# Patient Record
Sex: Female | Born: 1945
Health system: Southern US, Community
[De-identification: ages and names within clinical notes are randomized; demographics above are authoritative.]

## PROBLEM LIST (undated history)

## (undated) DIAGNOSIS — I10 Essential (primary) hypertension: Secondary | ICD-10-CM

## (undated) DIAGNOSIS — E079 Disorder of thyroid, unspecified: Secondary | ICD-10-CM

---

## 2000-06-21 ENCOUNTER — Encounter: Payer: Self-pay | Admitting: *Deleted

## 2000-06-21 ENCOUNTER — Encounter: Admission: RE | Admit: 2000-06-21 | Discharge: 2000-06-21 | Payer: Self-pay | Admitting: *Deleted

## 2001-01-05 ENCOUNTER — Encounter: Payer: Self-pay | Admitting: *Deleted

## 2001-01-05 ENCOUNTER — Encounter: Admission: RE | Admit: 2001-01-05 | Discharge: 2001-01-05 | Payer: Self-pay | Admitting: *Deleted

## 2001-06-23 ENCOUNTER — Encounter: Payer: Self-pay | Admitting: *Deleted

## 2001-06-23 ENCOUNTER — Encounter: Admission: RE | Admit: 2001-06-23 | Discharge: 2001-06-23 | Payer: Self-pay | Admitting: *Deleted

## 2002-07-10 ENCOUNTER — Encounter: Admission: RE | Admit: 2002-07-10 | Discharge: 2002-07-10 | Payer: Self-pay | Admitting: Unknown Physician Specialty

## 2002-07-10 ENCOUNTER — Encounter: Payer: Self-pay | Admitting: Unknown Physician Specialty

## 2003-07-13 ENCOUNTER — Encounter: Admission: RE | Admit: 2003-07-13 | Discharge: 2003-07-13 | Payer: Self-pay

## 2004-02-04 ENCOUNTER — Other Ambulatory Visit: Admission: RE | Admit: 2004-02-04 | Discharge: 2004-02-04 | Payer: Self-pay | Admitting: Family Medicine

## 2004-07-16 ENCOUNTER — Encounter: Admission: RE | Admit: 2004-07-16 | Discharge: 2004-07-16 | Payer: Self-pay | Admitting: Family Medicine

## 2005-04-06 ENCOUNTER — Other Ambulatory Visit: Admission: RE | Admit: 2005-04-06 | Discharge: 2005-04-06 | Payer: Self-pay | Admitting: Family Medicine

## 2005-07-16 ENCOUNTER — Encounter: Admission: RE | Admit: 2005-07-16 | Discharge: 2005-07-16 | Payer: Self-pay | Admitting: Family Medicine

## 2006-07-19 ENCOUNTER — Encounter: Admission: RE | Admit: 2006-07-19 | Discharge: 2006-07-19 | Payer: Self-pay | Admitting: Family Medicine

## 2006-09-14 ENCOUNTER — Other Ambulatory Visit: Admission: RE | Admit: 2006-09-14 | Discharge: 2006-09-14 | Payer: Self-pay | Admitting: Family Medicine

## 2007-02-04 ENCOUNTER — Ambulatory Visit (HOSPITAL_COMMUNITY): Admission: RE | Admit: 2007-02-04 | Discharge: 2007-02-04 | Payer: Self-pay | Admitting: Family Medicine

## 2007-07-22 ENCOUNTER — Encounter: Admission: RE | Admit: 2007-07-22 | Discharge: 2007-07-22 | Payer: Self-pay | Admitting: Family Medicine

## 2008-07-23 ENCOUNTER — Encounter: Admission: RE | Admit: 2008-07-23 | Discharge: 2008-07-23 | Payer: Self-pay | Admitting: Family Medicine

## 2009-07-26 ENCOUNTER — Encounter: Admission: RE | Admit: 2009-07-26 | Discharge: 2009-07-26 | Payer: Self-pay | Admitting: Family Medicine

## 2010-07-29 ENCOUNTER — Encounter
Admission: RE | Admit: 2010-07-29 | Discharge: 2010-07-29 | Payer: Self-pay | Source: Home / Self Care | Attending: Family Medicine | Admitting: Family Medicine

## 2011-06-22 ENCOUNTER — Other Ambulatory Visit: Payer: Self-pay | Admitting: Family Medicine

## 2011-06-22 DIAGNOSIS — Z1231 Encounter for screening mammogram for malignant neoplasm of breast: Secondary | ICD-10-CM

## 2011-07-31 ENCOUNTER — Ambulatory Visit: Payer: Self-pay

## 2011-12-21 ENCOUNTER — Ambulatory Visit
Admission: RE | Admit: 2011-12-21 | Discharge: 2011-12-21 | Disposition: A | Discharge status: Home / Self Care | Payer: Medicare Other | Source: Ambulatory Visit | Attending: Family Medicine | Admitting: Family Medicine

## 2011-12-21 DIAGNOSIS — Z1231 Encounter for screening mammogram for malignant neoplasm of breast: Secondary | ICD-10-CM

## 2012-11-22 ENCOUNTER — Other Ambulatory Visit: Payer: Self-pay

## 2012-11-22 DIAGNOSIS — Z1231 Encounter for screening mammogram for malignant neoplasm of breast: Secondary | ICD-10-CM

## 2012-12-28 ENCOUNTER — Ambulatory Visit: Payer: Medicare Other

## 2013-01-04 ENCOUNTER — Encounter: Payer: Medicare Other | Admitting: Nurse Practitioner

## 2013-01-12 ENCOUNTER — Other Ambulatory Visit: Payer: Self-pay | Admitting: Nurse Practitioner

## 2013-01-12 ENCOUNTER — Ambulatory Visit
Admission: RE | Admit: 2013-01-12 | Discharge: 2013-01-12 | Disposition: A | Discharge status: Home / Self Care | Payer: Medicare Other | Source: Ambulatory Visit

## 2013-01-12 DIAGNOSIS — R928 Other abnormal and inconclusive findings on diagnostic imaging of breast: Secondary | ICD-10-CM

## 2013-01-12 DIAGNOSIS — Z1231 Encounter for screening mammogram for malignant neoplasm of breast: Secondary | ICD-10-CM

## 2013-01-13 ENCOUNTER — Telehealth: Payer: Self-pay | Admitting: Family Medicine

## 2013-01-24 NOTE — Telephone Encounter (Signed)
Error

## 2013-02-07 ENCOUNTER — Ambulatory Visit
Admission: RE | Admit: 2013-02-07 | Discharge: 2013-02-07 | Disposition: A | Discharge status: Home / Self Care | Payer: Medicare Other | Source: Ambulatory Visit | Attending: Nurse Practitioner | Admitting: Nurse Practitioner

## 2013-02-07 DIAGNOSIS — R928 Other abnormal and inconclusive findings on diagnostic imaging of breast: Secondary | ICD-10-CM

## 2013-11-20 ENCOUNTER — Telehealth: Payer: Self-pay | Admitting: Nurse Practitioner

## 2013-11-20 NOTE — Telephone Encounter (Signed)
[facility]

## 2013-11-21 ENCOUNTER — Ambulatory Visit (INDEPENDENT_AMBULATORY_CARE_PROVIDER_SITE_OTHER): Payer: Medicare Other | Admitting: Nurse Practitioner

## 2013-11-21 ENCOUNTER — Encounter: Payer: Self-pay | Admitting: Nurse Practitioner

## 2013-11-21 VITALS — BP 131/83 | HR 87 | Temp 97.8°F | Ht 65.0 in | Wt 203.0 lb

## 2013-11-21 DIAGNOSIS — N63 Unspecified lump in unspecified breast: Secondary | ICD-10-CM

## 2013-11-21 DIAGNOSIS — N6310 Unspecified lump in the right breast, unspecified quadrant: Secondary | ICD-10-CM

## 2013-11-21 NOTE — Progress Notes (Signed)
   Subjective:    Patient ID: Meredith Ruiz, female    DOB: 06/28/1946, 68 y.o.   MRN: [id number]  HPI Patient in today c/o finding a lump in right breast- Found lump 2-3 nights ago- nontender and nonmobile. Last mammogram was last year and it was negative on Right side.    Review of Systems  Respiratory: Negative.   Cardiovascular: Negative.   All other systems reviewed and are negative.       Objective:   Physical Exam  Constitutional: She appears well-developed and well-nourished.  Cardiovascular: Normal rate, regular rhythm and normal heart sounds.   Pulmonary/Chest: Effort normal and breath sounds normal. Right breast exhibits mass. Right breast exhibits no inverted nipple, no nipple discharge, no skin change and no tenderness. Left breast exhibits no inverted nipple, no mass, no nipple discharge, no skin change and no tenderness.    Skin: Skin is warm and dry.  Psychiatric: She has a normal mood and affect. Her behavior is normal. Judgment and thought content normal.    BP 131/83  Pulse 87  Temp(Src) 97.8 F (36.6 C) (Oral)  Ht 5\' 5"  (1.651 m)  Wt 203 lb (92.08 kg)  BMI 33.78 kg/m2       Assessment & Plan:   1. Breast mass, right    Orders Placed This Encounter  Procedures  . MM Digital Diagnostic Unilat R    Standing Status: Future     Number of Occurrences:      Standing Expiration Date: 01/22/2015    Order Specific Question:  Reason for Exam (SYMPTOM  OR DIAGNOSIS REQUIRED)    Answer:  right screening    Order Specific Question:  Preferred imaging location?    [facility]   Keep check on area until mammogram is scheduled   Mary-Margaret Daphine DeutscherMartin, FNP

## 2013-11-21 NOTE — Patient Instructions (Signed)

## 2013-11-24 ENCOUNTER — Other Ambulatory Visit: Payer: Self-pay | Admitting: Nurse Practitioner

## 2013-11-24 DIAGNOSIS — N6310 Unspecified lump in the right breast, unspecified quadrant: Secondary | ICD-10-CM

## 2014-01-02 ENCOUNTER — Ambulatory Visit (INDEPENDENT_AMBULATORY_CARE_PROVIDER_SITE_OTHER): Payer: Medicare Other | Admitting: Nurse Practitioner

## 2014-01-02 ENCOUNTER — Encounter: Payer: Self-pay | Admitting: Nurse Practitioner

## 2014-01-02 VITALS — BP 165/86 | HR 102 | Temp 98.7°F | Ht 65.0 in | Wt 204.0 lb

## 2014-01-02 DIAGNOSIS — Z0000 Encounter for general adult medical examination without abnormal findings: Secondary | ICD-10-CM

## 2014-01-02 DIAGNOSIS — Z124 Encounter for screening for malignant neoplasm of cervix: Secondary | ICD-10-CM | POA: Diagnosis not present

## 2014-01-02 DIAGNOSIS — Z01419 Encounter for gynecological examination (general) (routine) without abnormal findings: Secondary | ICD-10-CM

## 2014-01-02 DIAGNOSIS — R87619 Unspecified abnormal cytological findings in specimens from cervix uteri: Secondary | ICD-10-CM | POA: Diagnosis not present

## 2014-01-02 LAB — POCT URINALYSIS DIPSTICK
Bilirubin, UA: NEGATIVE
Blood, UA: NEGATIVE
GLUCOSE UA: NEGATIVE
KETONES UA: NEGATIVE
LEUKOCYTES UA: NEGATIVE
Nitrite, UA: NEGATIVE
PROTEIN UA: NEGATIVE
Urobilinogen, UA: NEGATIVE
pH, UA: 5

## 2014-01-02 NOTE — Progress Notes (Signed)
   Subjective:    Patient ID: Meredith Ruiz, female    DOB: 02/18/1946, 68 y.o.   MRN: [id number]  HPI Patient in today for CPE and PAP- She has no current medical problems and is on no meds. SHe says that she is doing well and has no complaints.    Review of Systems  Constitutional: Negative.   HENT: Negative.   Respiratory: Negative.   Cardiovascular: Negative.   Gastrointestinal: Negative.   Genitourinary: Negative.   Neurological: Negative.   Hematological: Negative.   Psychiatric/Behavioral: Negative.   All other systems reviewed and are negative.      Objective:   Physical Exam  Constitutional: She is oriented to person, place, and time. She appears well-developed and well-nourished.  HENT:  Head: Normocephalic.  Right Ear: Hearing, tympanic membrane, external ear and ear canal normal.  Left Ear: Hearing, tympanic membrane, external ear and ear canal normal.  Nose: Nose normal.  Mouth/Throat: Uvula is midline and oropharynx is clear and moist.  Eyes: Conjunctivae and EOM are normal. Pupils are equal, round, and reactive to light.  Neck: Normal range of motion and full passive range of motion without pain. Neck supple. No JVD present. Carotid bruit is not present. No mass and no thyromegaly present.  Cardiovascular: Normal rate, normal heart sounds and intact distal pulses.   No murmur heard. Pulmonary/Chest: Effort normal and breath sounds normal. Right breast exhibits no inverted nipple, no mass, no nipple discharge, no skin change and no tenderness. Left breast exhibits no inverted nipple, no mass, no nipple discharge, no skin change and no tenderness.  Abdominal: Soft. Bowel sounds are normal. She exhibits no mass. There is no tenderness.  Genitourinary: Vagina normal and uterus normal. No breast swelling, tenderness, discharge or bleeding.  bimanual exam-No adnexal masses or tenderness. Cervix nonparous and pink- no vaginal discharge.  Musculoskeletal: Normal range  of motion.  Lymphadenopathy:    She has no cervical adenopathy.  Neurological: She is alert and oriented to person, place, and time.  Skin: Skin is warm and dry.  Psychiatric: She has a normal mood and affect. Her behavior is normal. Judgment and thought content normal.    BP 165/86  Pulse 102  Temp(Src) 98.7 F (37.1 C) (Oral)  Ht 5\' 5"  (1.651 m)  Wt 204 lb (92.534 kg)  BMI 33.95 kg/m2 ]      Assessment & Plan:   1. Annual physical exam   2. Encounter for routine gynecological examination    Orders Placed This Encounter  Procedures  . POCT urinalysis dipstick   Patient refuses blood work says that insurance will not pay for it Refuses all health maintenance Diet and exercise encourgaged RTO in 1 year and prn  Meredith Daphine DeutscherMartin, FNP

## 2014-01-02 NOTE — Patient Instructions (Signed)

## 2014-01-04 LAB — PAP IG (IMAGE GUIDED): PAP Smear Comment: 0

## 2014-01-09 ENCOUNTER — Telehealth: Payer: Self-pay | Admitting: Nurse Practitioner

## 2014-01-09 NOTE — Telephone Encounter (Signed)
Called to tell patient that we would contact with a follow up appointment for pap.  Number has message of unavailable.

## 2014-01-11 NOTE — Telephone Encounter (Signed)
Wrong number in epic to call patient say it is disconnected

## 2014-01-31 NOTE — Telephone Encounter (Signed)
Patient aware.

## 2014-12-13 DIAGNOSIS — W010XXA Fall on same level from slipping, tripping and stumbling without subsequent striking against object, initial encounter: Secondary | ICD-10-CM | POA: Diagnosis not present

## 2014-12-13 DIAGNOSIS — Y92481 Parking lot as the place of occurrence of the external cause: Secondary | ICD-10-CM | POA: Diagnosis not present

## 2014-12-13 DIAGNOSIS — S5011XA Contusion of right forearm, initial encounter: Secondary | ICD-10-CM | POA: Diagnosis not present

## 2014-12-13 DIAGNOSIS — S8001XA Contusion of right knee, initial encounter: Secondary | ICD-10-CM | POA: Diagnosis not present

## 2014-12-13 DIAGNOSIS — M79631 Pain in right forearm: Secondary | ICD-10-CM | POA: Diagnosis not present

## 2014-12-13 DIAGNOSIS — Z87891 Personal history of nicotine dependence: Secondary | ICD-10-CM | POA: Diagnosis not present

## 2014-12-13 DIAGNOSIS — S59911A Unspecified injury of right forearm, initial encounter: Secondary | ICD-10-CM | POA: Diagnosis not present

## 2015-02-11 ENCOUNTER — Other Ambulatory Visit: Payer: Self-pay

## 2016-01-07 ENCOUNTER — Encounter: Payer: Medicare Other | Admitting: Nurse Practitioner

## 2016-01-09 ENCOUNTER — Encounter: Payer: Self-pay | Admitting: Nurse Practitioner

## 2016-01-09 ENCOUNTER — Ambulatory Visit (INDEPENDENT_AMBULATORY_CARE_PROVIDER_SITE_OTHER): Payer: Medicare Other | Admitting: Nurse Practitioner

## 2016-01-09 ENCOUNTER — Ambulatory Visit (INDEPENDENT_AMBULATORY_CARE_PROVIDER_SITE_OTHER): Payer: Medicare Other

## 2016-01-09 VITALS — BP 153/84 | HR 79 | Temp 97.0°F | Ht 65.0 in | Wt 200.4 lb

## 2016-01-09 DIAGNOSIS — Z1159 Encounter for screening for other viral diseases: Secondary | ICD-10-CM | POA: Diagnosis not present

## 2016-01-09 DIAGNOSIS — Z1212 Encounter for screening for malignant neoplasm of rectum: Secondary | ICD-10-CM | POA: Diagnosis not present

## 2016-01-09 DIAGNOSIS — I10 Essential (primary) hypertension: Secondary | ICD-10-CM | POA: Diagnosis not present

## 2016-01-09 DIAGNOSIS — Z0000 Encounter for general adult medical examination without abnormal findings: Secondary | ICD-10-CM

## 2016-01-09 DIAGNOSIS — Z01419 Encounter for gynecological examination (general) (routine) without abnormal findings: Secondary | ICD-10-CM | POA: Diagnosis not present

## 2016-01-09 DIAGNOSIS — R875 Abnormal microbiological findings in specimens from female genital organs: Secondary | ICD-10-CM | POA: Diagnosis not present

## 2016-01-09 MED ORDER — LISINOPRIL 10 MG PO TABS: 10.0000 mg | ORAL_TABLET | Freq: Every day | ORAL | Status: DC

## 2016-01-09 NOTE — Addendum Note (Signed)
Addended by: Margorie JohnJOHNSON, Louellen Haldeman M on: 01/09/2016 04:04 PM   Modules accepted: Orders

## 2016-01-09 NOTE — Patient Instructions (Signed)
Hypertension Hypertension, commonly called high blood pressure, is when the force of blood pumping through your arteries is too strong. Your arteries are the blood vessels that carry blood from your heart throughout your body. A blood pressure reading consists of a higher number over a lower number, such as 110/72. The higher number (systolic) is the pressure inside your arteries when your heart pumps. The lower number (diastolic) is the pressure inside your arteries when your heart relaxes. Ideally you want your blood pressure below 120/80. Hypertension forces your heart to work harder to pump blood. Your arteries may become narrow or stiff. Having untreated or uncontrolled hypertension can cause heart attack, stroke, kidney disease, and other problems. RISK FACTORS Some risk factors for high blood pressure are controllable. Others are not.  Risk factors you cannot control include:   Race. You may be at higher risk if you are African American.  Age. Risk increases with age.  Gender. Men are at higher risk than women before age 45 years. After age 65, women are at higher risk than men. Risk factors you can control include:  Not getting enough exercise or physical activity.  Being overweight.  Getting too much fat, sugar, calories, or salt in your diet.  Drinking too much alcohol. SIGNS AND SYMPTOMS Hypertension does not usually cause signs or symptoms. Extremely high blood pressure (hypertensive crisis) may cause headache, anxiety, shortness of breath, and nosebleed. DIAGNOSIS To check if you have hypertension, your health care provider will measure your blood pressure while you are seated, with your arm held at the level of your heart. It should be measured at least twice using the same arm. Certain conditions can cause a difference in blood pressure between your right and left arms. A blood pressure reading that is higher than normal on one occasion does not mean that you need treatment. If  it is not clear whether you have high blood pressure, you may be asked to return on a different day to have your blood pressure checked again. Or, you may be asked to monitor your blood pressure at home for 1 or more weeks. TREATMENT Treating high blood pressure includes making lifestyle changes and possibly taking medicine. Living a healthy lifestyle can help lower high blood pressure. You may need to change some of your habits. Lifestyle changes may include:  Following the DASH diet. This diet is high in fruits, vegetables, and whole grains. It is low in salt, red meat, and added sugars.  Keep your sodium intake below 2,300 mg per day.  Getting at least 30-45 minutes of aerobic exercise at least 4 times per week.  Losing weight if necessary.  Not smoking.  Limiting alcoholic beverages.  Learning ways to reduce stress. Your health care provider may prescribe medicine if lifestyle changes are not enough to get your blood pressure under control, and if one of the following is true:  You are 18-59 years of age and your systolic blood pressure is above 140.  You are 60 years of age or older, and your systolic blood pressure is above 150.  Your diastolic blood pressure is above 90.  You have diabetes, and your systolic blood pressure is over 140 or your diastolic blood pressure is over 90.  You have kidney disease and your blood pressure is above 140/90.  You have heart disease and your blood pressure is above 140/90. Your personal target blood pressure may vary depending on your medical conditions, your age, and other factors. HOME CARE INSTRUCTIONS    Have your blood pressure rechecked as directed by your health care provider.   Take medicines only as directed by your health care provider. Follow the directions carefully. Blood pressure medicines must be taken as prescribed. The medicine does not work as well when you skip doses. Skipping doses also puts you at risk for  problems.  Do not smoke.   Monitor your blood pressure at home as directed by your health care provider. SEEK MEDICAL CARE IF:   You think you are having a reaction to medicines taken.  You have recurrent headaches or feel dizzy.  You have swelling in your ankles.  You have trouble with your vision. SEEK IMMEDIATE MEDICAL CARE IF:  You develop a severe headache or confusion.  You have unusual weakness, numbness, or feel faint.  You have severe chest or abdominal pain.  You vomit repeatedly.  You have trouble breathing. MAKE SURE YOU:   Understand these instructions.  Will watch your condition.  Will get help right away if you are not doing well or get worse.   This information is not intended to replace advice given to you by your health care provider. Make sure you discuss any questions you have with your health care provider.   Document Released: 08/03/2005 Document Revised: 12/18/2014 Document Reviewed: 05/26/2013 Elsevier Interactive Patient Education 2016 Elsevier Inc.  

## 2016-01-09 NOTE — Progress Notes (Signed)
   Subjective:    Patient ID: Meredith Ruiz, female    DOB: [birth year], 70 y.o.   MRN: [id number]  HPI Patient comes in today for annual physical exam and PAP. She has no current medical problems and is on no meds. SHe says that she is doing well without complaints today.    Review of Systems  Constitutional: Negative.   HENT: Negative.   Respiratory: Negative.   Cardiovascular: Negative.   Gastrointestinal: Negative.   Genitourinary: Negative.   Neurological: Negative.   Psychiatric/Behavioral: Negative.   All other systems reviewed and are negative.      Objective:   Physical Exam  Constitutional: She is oriented to person, place, and time. She appears well-developed and well-nourished.  HENT:  Head: Normocephalic.  Right Ear: Hearing, tympanic membrane, external ear and ear canal normal.  Left Ear: Hearing, tympanic membrane, external ear and ear canal normal.  Nose: Nose normal.  Mouth/Throat: Uvula is midline and oropharynx is clear and moist.  Eyes: Conjunctivae and EOM are normal. Pupils are equal, round, and reactive to light.  Neck: Normal range of motion and full passive range of motion without pain. Neck supple. No JVD present. Carotid bruit is not present. No thyroid mass and no thyromegaly present.  Cardiovascular: Normal rate, normal heart sounds and intact distal pulses.   No murmur heard. Pulmonary/Chest: Effort normal and breath sounds normal. Right breast exhibits no inverted nipple, no mass, no nipple discharge, no skin change and no tenderness. Left breast exhibits no inverted nipple, no mass, no nipple discharge, no skin change and no tenderness.  Abdominal: Soft. Bowel sounds are normal. She exhibits no mass. There is no tenderness.  Genitourinary: Vagina normal and uterus normal. No breast swelling, tenderness, discharge or bleeding.  bimanual exam-No adnexal masses or tenderness. Cervix nonparous and pink- no vaginal discharge  Musculoskeletal: Normal  range of motion.  Lymphadenopathy:    She has no cervical adenopathy.  Neurological: She is alert and oriented to person, place, and time.  Skin: Skin is warm and dry.  Multiple greyish moles all over back- all have regular broders.  Psychiatric: She has a normal mood and affect. Her behavior is normal. Judgment and thought content normal.   BP 153/84 mmHg  Pulse 79  Temp(Src) 97 F (36.1 C) (Oral)  Ht _0  (1.651 m)  Wt 200 lb 6.4 oz (90.901 kg)  BMI 33.35 kg/m2  Chest x ray- no cardiopulmonary disease noted-Preliminary reading by Ronnald Collum, FNP  [facility]   EKG- Kerry Hough, FNP      Assessment & Plan:  1. Annual physical exam - DG Chest 2 View; Future - Thyroid Panel With TSH  2. Encounter for routine gynecological examination - Pap IG (Image Guided)  3. Screening for malignant neoplasm of the rectum - Fecal occult blood, imunochemical; Future  4. Need for hepatitis C screening test - Hepatitis C antibody  5. Essential hypertension/newly diagnosed Do not add salt o diet - DG Chest 2 View; Future - EKG 12-Lead - CMP14+EGFR - Lipid panel - lisinopril (PRINIVIL,ZESTRIL) 10 MG tablet; Take 1 tablet (10 mg total) by mouth daily.  Dispense: 90 tablet; Refill: 3    Labs pending Health maintenance reviewed Diet and exercise encouraged Continue all meds Follow up  In 6 months   [place], FNP

## 2016-01-10 ENCOUNTER — Other Ambulatory Visit: Payer: Self-pay | Admitting: Nurse Practitioner

## 2016-01-10 DIAGNOSIS — E039 Hypothyroidism, unspecified: Secondary | ICD-10-CM

## 2016-01-10 LAB — CMP14+EGFR
A/G RATIO: 1.6 (ref 1.2–2.2)
ALBUMIN: 4.2 g/dL (ref 3.6–4.8)
ALT: 10 IU/L (ref 0–32)
AST: 11 IU/L (ref 0–40)
Alkaline Phosphatase: 135 IU/L — ABNORMAL HIGH (ref 39–117)
BUN / CREAT RATIO: 17 (ref 12–28)
BUN: 16 mg/dL (ref 8–27)
Bilirubin Total: 0.3 mg/dL (ref 0.0–1.2)
CALCIUM: 9.3 mg/dL (ref 8.7–10.3)
CO2: 24 mmol/L (ref 18–29)
CREATININE: 0.94 mg/dL (ref 0.57–1.00)
Chloride: 104 mmol/L (ref 96–106)
GFR, EST AFRICAN AMERICAN: 72 mL/min/{1.73_m2} (ref >59)
GFR, EST NON AFRICAN AMERICAN: 62 mL/min/{1.73_m2} (ref >59)
GLOBULIN, TOTAL: 2.7 g/dL (ref 1.5–4.5)
Glucose: 116 mg/dL — ABNORMAL HIGH (ref 65–99)
Potassium: 4.1 mmol/L (ref 3.5–5.2)
SODIUM: 143 mmol/L (ref 134–144)
Total Protein: 6.9 g/dL (ref 6.0–8.5)

## 2016-01-10 LAB — LIPID PANEL
CHOL/HDL RATIO: 3.9 ratio (ref 0.0–4.4)
Cholesterol, Total: 185 mg/dL (ref 100–199)
HDL: 47 mg/dL (ref >39)
LDL CALC: 121 mg/dL — AB (ref 0–99)
Triglycerides: 86 mg/dL (ref 0–149)
VLDL Cholesterol Cal: 17 mg/dL (ref 5–40)

## 2016-01-10 LAB — THYROID PANEL WITH TSH
Free Thyroxine Index: 1.4 (ref 1.2–4.9)
T3 UPTAKE RATIO: 27 % (ref 24–39)
T4 TOTAL: 5.3 ug/dL (ref 4.5–12.0)
TSH: 7.43 u[IU]/mL — AB (ref 0.450–4.500)

## 2016-01-10 LAB — FECAL OCCULT BLOOD, IMMUNOCHEMICAL: FECAL OCCULT BLD: NEGATIVE

## 2016-01-10 LAB — HEPATITIS C ANTIBODY: Hep C Virus Ab: <0.1 s/co ratio (ref 0.0–0.9)

## 2016-01-10 MED ORDER — LEVOTHYROXINE SODIUM 50 MCG PO TABS: 50.0000 ug | ORAL_TABLET | Freq: Every day | ORAL | Status: DC

## 2016-01-14 ENCOUNTER — Other Ambulatory Visit: Payer: Self-pay | Admitting: Nurse Practitioner

## 2016-01-14 LAB — PAP IG (IMAGE GUIDED): PAP Smear Comment: 0

## 2016-01-14 MED ORDER — FLUCONAZOLE 150 MG PO TABS: 150.0000 mg | ORAL_TABLET | Freq: Once | ORAL | Status: DC

## 2016-01-15 ENCOUNTER — Other Ambulatory Visit: Payer: Self-pay | Admitting: *Deleted

## 2016-01-15 DIAGNOSIS — E039 Hypothyroidism, unspecified: Secondary | ICD-10-CM

## 2016-02-17 ENCOUNTER — Telehealth: Payer: Self-pay | Admitting: Nurse Practitioner

## 2016-02-19 NOTE — Telephone Encounter (Signed)
Labs mailed

## 2016-03-16 ENCOUNTER — Encounter: Payer: Medicare Other | Admitting: *Deleted

## 2016-03-16 DIAGNOSIS — Z1231 Encounter for screening mammogram for malignant neoplasm of breast: Secondary | ICD-10-CM | POA: Diagnosis not present

## 2016-06-15 ENCOUNTER — Telehealth: Payer: Self-pay

## 2016-06-15 NOTE — Telephone Encounter (Signed)
Form brought in has to be filled out by patient not provider- why can she not do jury duty

## 2016-06-15 NOTE — Telephone Encounter (Signed)
lmtcb jkp 10/30 

## 2016-06-15 NOTE — Telephone Encounter (Signed)
Patient would like a note for jury duty. She has a paper that she will drop by to be filled out but she would also like a note to send in as well. Please advise and route to pool B. Please call her at [phone]

## 2016-06-17 NOTE — Telephone Encounter (Signed)
Advised patient that we couldn't write an excuse for jury duty without a valid medical reason why she can't do it. Patient states that she has thyroid trouble and is 70 years old. Advised patient we didn't have medical reason for note. Patient came back to office and picked up forms that she had brought by

## 2016-07-02 ENCOUNTER — Other Ambulatory Visit: Payer: Self-pay | Admitting: Nurse Practitioner

## 2016-07-02 DIAGNOSIS — E039 Hypothyroidism, unspecified: Secondary | ICD-10-CM

## 2016-07-13 ENCOUNTER — Ambulatory Visit: Payer: Medicare Other | Admitting: Nurse Practitioner

## 2016-07-13 DIAGNOSIS — M79601 Pain in right arm: Secondary | ICD-10-CM | POA: Diagnosis not present

## 2016-07-13 DIAGNOSIS — R0902 Hypoxemia: Secondary | ICD-10-CM | POA: Diagnosis not present

## 2016-07-13 DIAGNOSIS — Z23 Encounter for immunization: Secondary | ICD-10-CM | POA: Diagnosis not present

## 2016-07-13 DIAGNOSIS — S59911A Unspecified injury of right forearm, initial encounter: Secondary | ICD-10-CM | POA: Diagnosis not present

## 2016-07-13 DIAGNOSIS — S50311A Abrasion of right elbow, initial encounter: Secondary | ICD-10-CM | POA: Diagnosis not present

## 2016-07-13 DIAGNOSIS — S62161A Displaced fracture of pisiform, right wrist, initial encounter for closed fracture: Secondary | ICD-10-CM | POA: Diagnosis not present

## 2016-07-13 DIAGNOSIS — M25569 Pain in unspecified knee: Secondary | ICD-10-CM | POA: Diagnosis not present

## 2016-07-13 DIAGNOSIS — S80211A Abrasion, right knee, initial encounter: Secondary | ICD-10-CM | POA: Diagnosis not present

## 2016-07-13 DIAGNOSIS — M79641 Pain in right hand: Secondary | ICD-10-CM | POA: Diagnosis not present

## 2016-07-13 DIAGNOSIS — S52124A Nondisplaced fracture of head of right radius, initial encounter for closed fracture: Secondary | ICD-10-CM | POA: Diagnosis not present

## 2016-07-13 DIAGNOSIS — S80212A Abrasion, left knee, initial encounter: Secondary | ICD-10-CM | POA: Diagnosis not present

## 2016-07-13 DIAGNOSIS — M25561 Pain in right knee: Secondary | ICD-10-CM | POA: Diagnosis not present

## 2016-07-13 DIAGNOSIS — S4991XA Unspecified injury of right shoulder and upper arm, initial encounter: Secondary | ICD-10-CM | POA: Diagnosis not present

## 2016-07-13 DIAGNOSIS — S62164A Nondisplaced fracture of pisiform, right wrist, initial encounter for closed fracture: Secondary | ICD-10-CM | POA: Diagnosis not present

## 2016-07-13 DIAGNOSIS — S60511A Abrasion of right hand, initial encounter: Secondary | ICD-10-CM | POA: Diagnosis not present

## 2016-07-13 DIAGNOSIS — W172XXA Fall into hole, initial encounter: Secondary | ICD-10-CM | POA: Diagnosis not present

## 2016-07-13 DIAGNOSIS — S52121A Displaced fracture of head of right radius, initial encounter for closed fracture: Secondary | ICD-10-CM | POA: Diagnosis not present

## 2016-07-13 DIAGNOSIS — M25562 Pain in left knee: Secondary | ICD-10-CM | POA: Diagnosis not present

## 2016-07-13 DIAGNOSIS — M25511 Pain in right shoulder: Secondary | ICD-10-CM | POA: Diagnosis not present

## 2016-07-13 DIAGNOSIS — S6991XA Unspecified injury of right wrist, hand and finger(s), initial encounter: Secondary | ICD-10-CM | POA: Diagnosis not present

## 2016-07-22 DIAGNOSIS — Z9181 History of falling: Secondary | ICD-10-CM | POA: Diagnosis not present

## 2016-07-22 DIAGNOSIS — M79601 Pain in right arm: Secondary | ICD-10-CM | POA: Diagnosis not present

## 2016-07-27 ENCOUNTER — Ambulatory Visit (INDEPENDENT_AMBULATORY_CARE_PROVIDER_SITE_OTHER): Payer: Medicare Other | Admitting: Nurse Practitioner

## 2016-07-27 ENCOUNTER — Encounter: Payer: Self-pay | Admitting: Nurse Practitioner

## 2016-07-27 VITALS — BP 142/76 | HR 80 | Temp 97.3°F | Ht 65.0 in | Wt 197.0 lb

## 2016-07-27 DIAGNOSIS — E039 Hypothyroidism, unspecified: Secondary | ICD-10-CM

## 2016-07-27 DIAGNOSIS — I10 Essential (primary) hypertension: Secondary | ICD-10-CM

## 2016-07-27 DIAGNOSIS — Z6832 Body mass index (BMI) 32.0-32.9, adult: Secondary | ICD-10-CM | POA: Diagnosis not present

## 2016-07-27 MED ORDER — LISINOPRIL 20 MG PO TABS: 20.0000 mg | ORAL_TABLET | Freq: Every day | ORAL | 1 refills | Status: DC

## 2016-07-27 NOTE — Progress Notes (Signed)
Subjective:    Patient ID: Meredith Ruiz, female    DOB: Feb 04, 1946, 70 y.o.   MRN: [id number]   Patient here today for follow up of chronic medical problems.  Outpatient Encounter Prescriptions as of 07/27/2016  Medication Sig  . aspirin 81 MG tablet Take 81 mg by mouth daily.  Marland Kitchen levothyroxine (SYNTHROID, LEVOTHROID) 50 MCG tablet TAKE ONE TABLET BY MOUTH ONCE DAILY BEFORE BREAKFAST  . lisinopril (PRINIVIL,ZESTRIL) 10 MG tablet Take 1 tablet (10 mg total) by mouth daily.    Hypertension  This is a recurrent problem. Episode onset: dx 6 months ago. The problem has been gradually improving since onset. The problem is controlled. Pertinent negatives include no anxiety, chest pain, headaches, palpitations or shortness of breath. Risk factors for coronary artery disease include obesity, post-menopausal state and sedentary lifestyle. Past treatments include ACE inhibitors. The current treatment provides significant improvement. Compliance problems include diet and exercise.  Hypertensive end-organ damage includes a thyroid problem. There is no history of CAD/MI or retinopathy.  Thyroid Problem  Presents for follow-up (hypohyroidism) visit. Patient reports no palpitations. The symptoms have been stable.    * Patient fell 07/13/16 and was told in ER at [facility] that she had fracture in right elbow- saw ortho one week later and was told no fracture- still has some right arm soreness with use but is better.    Review of Systems  Constitutional: Negative.   HENT: Negative.   Respiratory: Negative for shortness of breath.   Cardiovascular: Negative for chest pain and palpitations.  Gastrointestinal: Negative.   Genitourinary: Negative.   Musculoskeletal: Negative.   Neurological: Negative for headaches.  Psychiatric/Behavioral: Negative.   All other systems reviewed and are negative.      Objective:   Physical Exam  Constitutional: She is oriented to person, place, and time. She  appears well-developed and well-nourished.  HENT:  Nose: Nose normal.  Mouth/Throat: Oropharynx is clear and moist.  Eyes: EOM are normal.  Neck: Trachea normal, normal range of motion and full passive range of motion without pain. Neck supple. No JVD present. Carotid bruit is not present. No thyromegaly present.  Cardiovascular: Normal rate, regular rhythm, normal heart sounds and intact distal pulses.  Exam reveals no gallop and no friction rub.   No murmur heard. Pulmonary/Chest: Effort normal and breath sounds normal.  Abdominal: Soft. Bowel sounds are normal. She exhibits no distension and no mass. There is no tenderness.  Musculoskeletal: Normal range of motion.  Lymphadenopathy:    She has no cervical adenopathy.  Neurological: She is alert and oriented to person, place, and time. She has normal reflexes.  Skin: Skin is warm and dry.  Psychiatric: She has a normal mood and affect. Her behavior is normal. Judgment and thought content normal.   BP (!) 142/76 (BP Location: Left Arm, Cuff Size: Normal)   Pulse 80   Temp 97.3 F (36.3 C) (Oral)   Ht '5\' 5"'$  (1.651 m)   Wt 197 lb (89.4 kg)   BMI 32.78 kg/m       Assessment & Plan:  1. BMI 32.0-32.9,adult Discussed diet and exercise for person with BMI >25 Will recheck weight in 3-6 months  2. Essential hypertension Low sodium diet Increased lisinopril from '10mg'$  daily to '20mg'$  daily - CMP14+EGFR - Lipid panel - lisinopril (PRINIVIL,ZESTRIL) 20 MG tablet; Take 1 tablet (20 mg total) by mouth daily.  Dispense: 90 tablet; Refill: 1  3. Acquired hypothyroidism - Thyroid Panel With TSH  Labs pending Health maintenance reviewed Diet and exercise encouraged Continue all meds Follow up  In 3 months   [place], FNP

## 2016-07-27 NOTE — Patient Instructions (Signed)
DASH Eating Plan DASH stands for "Dietary Approaches to Stop Hypertension." The DASH eating plan is a healthy eating plan that has been shown to reduce high blood pressure (hypertension). Additional health benefits may include reducing the risk of type 2 diabetes mellitus, heart disease, and stroke. The DASH eating plan may also help with weight loss. What do I need to know about the DASH eating plan? For the DASH eating plan, you will follow these general guidelines:  Choose foods with less than 150 milligrams of sodium per serving (as listed on the food label).  Use salt-free seasonings or herbs instead of table salt or sea salt.  Check with your health care provider or pharmacist before using salt substitutes.  Eat lower-sodium products. These are often labeled as "low-sodium" or "no salt added."  Eat fresh foods. Avoid eating a lot of canned foods.  Eat more vegetables, fruits, and low-fat dairy products.  Choose whole grains. Look for the word "whole" as the first word in the ingredient list.  Choose fish and skinless chicken or turkey more often than red meat. Limit fish, poultry, and meat to 6 oz (170 g) each day.  Limit sweets, desserts, sugars, and sugary drinks.  Choose heart-healthy fats.  Eat more home-cooked food and less restaurant, buffet, and fast food.  Limit fried foods.  Do not fry foods. Cook foods using methods such as baking, boiling, grilling, and broiling instead.  When eating at a restaurant, ask that your food be prepared with less salt, or no salt if possible. What foods can I eat? Seek help from a dietitian for individual calorie needs. Grains  Whole grain or whole wheat bread. Brown rice. Whole grain or whole wheat pasta. Quinoa, bulgur, and whole grain cereals. Low-sodium cereals. Corn or whole wheat flour tortillas. Whole grain cornbread. Whole grain crackers. Low-sodium crackers. Vegetables  Fresh or frozen vegetables (raw, steamed, roasted, or  grilled). Low-sodium or reduced-sodium tomato and vegetable juices. Low-sodium or reduced-sodium tomato sauce and paste. Low-sodium or reduced-sodium canned vegetables. Fruits  All fresh, canned (in natural juice), or frozen fruits. Meat and Other Protein Products  Ground beef (85% or leaner), grass-fed beef, or beef trimmed of fat. Skinless chicken or turkey. Ground chicken or turkey. Pork trimmed of fat. All fish and seafood. Eggs. Dried beans, peas, or lentils. Unsalted nuts and seeds. Unsalted canned beans. Dairy  Low-fat dairy products, such as skim or 1% milk, 2% or reduced-fat cheeses, low-fat ricotta or cottage cheese, or plain low-fat yogurt. Low-sodium or reduced-sodium cheeses. Fats and Oils  Tub margarines without trans fats. Light or reduced-fat mayonnaise and salad dressings (reduced sodium). Avocado. Safflower, olive, or canola oils. Natural peanut or almond butter. Other  Unsalted popcorn and pretzels. The items listed above may not be a complete list of recommended foods or beverages. Contact your dietitian for more options.  What foods are not recommended? Grains  White bread. White pasta. White rice. Refined cornbread. Bagels and croissants. Crackers that contain trans fat. Vegetables  Creamed or fried vegetables. Vegetables in a cheese sauce. Regular canned vegetables. Regular canned tomato sauce and paste. Regular tomato and vegetable juices. Fruits  Canned fruit in light or heavy syrup. Fruit juice. Meat and Other Protein Products  Fatty cuts of meat. Ribs, chicken wings, bacon, sausage, bologna, salami, chitterlings, fatback, hot dogs, bratwurst, and packaged luncheon meats. Salted nuts and seeds. Canned beans with salt. Dairy  Whole or 2% milk, cream, half-and-half, and cream cheese. Whole-fat or sweetened yogurt. Full-fat cheeses   or blue cheese. Nondairy creamers and whipped toppings. Processed cheese, cheese spreads, or cheese curds. Condiments  Onion and garlic  salt, seasoned salt, table salt, and sea salt. Canned and packaged gravies. Worcestershire sauce. Tartar sauce. Barbecue sauce. Teriyaki sauce. Soy sauce, including reduced sodium. Steak sauce. Fish sauce. Oyster sauce. Cocktail sauce. Horseradish. Ketchup and mustard. Meat flavorings and tenderizers. Bouillon cubes. Hot sauce. Tabasco sauce. Marinades. Taco seasonings. Relishes. Fats and Oils  Butter, stick margarine, lard, shortening, ghee, and bacon fat. Coconut, palm kernel, or palm oils. Regular salad dressings. Other  Pickles and olives. Salted popcorn and pretzels. The items listed above may not be a complete list of foods and beverages to avoid. Contact your dietitian for more information.  Where can I find more information? National Heart, Lung, and Blood Institute: www.nhlbi.nih.gov/health/health-topics/topics/dash/ This information is not intended to replace advice given to you by your health care provider. Make sure you discuss any questions you have with your health care provider. Document Released: 07/23/2011 Document Revised: 01/09/2016 Document Reviewed: 06/07/2013 Elsevier Interactive Patient Education  2017 Elsevier Inc.  

## 2016-07-28 ENCOUNTER — Other Ambulatory Visit: Payer: Self-pay | Admitting: Nurse Practitioner

## 2016-07-28 LAB — LIPID PANEL
CHOL/HDL RATIO: 3.7 ratio (ref 0.0–4.4)
CHOLESTEROL TOTAL: 198 mg/dL (ref 100–199)
HDL: 53 mg/dL (ref >39)
LDL CALC: 132 mg/dL — AB (ref 0–99)
TRIGLYCERIDES: 67 mg/dL (ref 0–149)
VLDL CHOLESTEROL CAL: 13 mg/dL (ref 5–40)

## 2016-07-28 LAB — THYROID PANEL WITH TSH
Free Thyroxine Index: 2.3 (ref 1.2–4.9)
T3 Uptake Ratio: 27 % (ref 24–39)
T4, Total: 8.6 ug/dL (ref 4.5–12.0)
TSH: 0.27 u[IU]/mL — ABNORMAL LOW (ref 0.450–4.500)

## 2016-07-28 LAB — CMP14+EGFR
ALK PHOS: 146 IU/L — AB (ref 39–117)
ALT: 12 IU/L (ref 0–32)
AST: 14 IU/L (ref 0–40)
Albumin/Globulin Ratio: 1.5 (ref 1.2–2.2)
Albumin: 4.1 g/dL (ref 3.6–4.8)
BUN/Creatinine Ratio: 19 (ref 12–28)
BUN: 16 mg/dL (ref 8–27)
Bilirubin Total: 0.5 mg/dL (ref 0.0–1.2)
CALCIUM: 9.2 mg/dL (ref 8.7–10.3)
CO2: 26 mmol/L (ref 18–29)
CREATININE: 0.84 mg/dL (ref 0.57–1.00)
Chloride: 101 mmol/L (ref 96–106)
GFR calc Af Amer: 82 mL/min/{1.73_m2} (ref >59)
GFR, EST NON AFRICAN AMERICAN: 71 mL/min/{1.73_m2} (ref >59)
GLUCOSE: 90 mg/dL (ref 65–99)
Globulin, Total: 2.8 g/dL (ref 1.5–4.5)
Potassium: 4.2 mmol/L (ref 3.5–5.2)
Sodium: 141 mmol/L (ref 134–144)
Total Protein: 6.9 g/dL (ref 6.0–8.5)

## 2016-07-28 MED ORDER — LEVOTHYROXINE SODIUM 25 MCG PO TABS: 25.0000 ug | ORAL_TABLET | Freq: Every day | ORAL | 5 refills | Status: DC

## 2016-08-05 DIAGNOSIS — M7521 Bicipital tendinitis, right shoulder: Secondary | ICD-10-CM | POA: Diagnosis not present

## 2016-08-05 DIAGNOSIS — M25521 Pain in right elbow: Secondary | ICD-10-CM | POA: Diagnosis not present

## 2016-08-05 DIAGNOSIS — M79601 Pain in right arm: Secondary | ICD-10-CM | POA: Diagnosis not present

## 2016-08-07 DIAGNOSIS — Z01818 Encounter for other preprocedural examination: Secondary | ICD-10-CM | POA: Diagnosis not present

## 2016-08-14 ENCOUNTER — Telehealth: Payer: Self-pay | Admitting: Nurse Practitioner

## 2016-08-14 NOTE — Telephone Encounter (Signed)
Tried to contact patient. No answer.

## 2016-08-14 NOTE — Telephone Encounter (Signed)
Just do decongestant OTC- do not have rx meds for this Disregard noteon plavix- on wrong chart

## 2016-08-14 NOTE — Telephone Encounter (Signed)
Patient states she has been having nasal congestion and cough x 1 week. Patient states she has not had any fever. Would like rx sent to pharmacy. Please advise and send back to pools.

## 2016-08-14 NOTE — Telephone Encounter (Signed)
Stop plavix 3 day sprior to procedure

## 2016-08-18 NOTE — Telephone Encounter (Signed)
Attempted to contact patient.  Number listed is not accepting calls at this time

## 2016-09-03 ENCOUNTER — Emergency Department (HOSPITAL_COMMUNITY)
Admission: EM | Admit: 2016-09-03 | Discharge: 2016-09-03 | Disposition: A | Discharge status: Home / Self Care | Payer: Medicare Other | Attending: Emergency Medicine | Admitting: Emergency Medicine

## 2016-09-03 ENCOUNTER — Encounter (HOSPITAL_COMMUNITY): Payer: Self-pay | Admitting: *Deleted

## 2016-09-03 DIAGNOSIS — H1032 Unspecified acute conjunctivitis, left eye: Secondary | ICD-10-CM

## 2016-09-03 DIAGNOSIS — Z7982 Long term (current) use of aspirin: Secondary | ICD-10-CM | POA: Insufficient documentation

## 2016-09-03 DIAGNOSIS — H1089 Other conjunctivitis: Secondary | ICD-10-CM | POA: Diagnosis not present

## 2016-09-03 DIAGNOSIS — Z87891 Personal history of nicotine dependence: Secondary | ICD-10-CM | POA: Insufficient documentation

## 2016-09-03 DIAGNOSIS — Z79899 Other long term (current) drug therapy: Secondary | ICD-10-CM | POA: Insufficient documentation

## 2016-09-03 DIAGNOSIS — I10 Essential (primary) hypertension: Secondary | ICD-10-CM | POA: Diagnosis not present

## 2016-09-03 DIAGNOSIS — J029 Acute pharyngitis, unspecified: Secondary | ICD-10-CM | POA: Insufficient documentation

## 2016-09-03 HISTORY — DX: Disorder of thyroid, unspecified: E07.9

## 2016-09-03 HISTORY — DX: Essential (primary) hypertension: I10

## 2016-09-03 MED ORDER — AMOXICILLIN 250 MG/5ML PO SUSR
500.0000 mg | Freq: Once | ORAL | Status: AC
  Administered 2016-09-03: 500 mg via ORAL
  Filled 2016-09-03: qty 10

## 2016-09-03 MED ORDER — AMOXICILLIN 250 MG/5ML PO SUSR: 500.0000 mg | Freq: Three times a day (TID) | ORAL | 0 refills | Status: DC

## 2016-09-03 NOTE — ED Provider Notes (Signed)
[facility] Provider Note   CSN: [id number] Arrival date & time: 09/03/16  1720     History   Chief Complaint Chief Complaint  Patient presents with  . Sore Throat    HPI Meredith Ruiz is a 71 y.o. female.  She presents for evaluation of sore throat and drainage from her left eye. She states that 2 family members have "strep throat" in her being treated currently. She has pain in her throat with swallowing. She denies cough, weakness, dizziness, nausea or vomiting. There are no other known modifying factors.  HPI  Past Medical History:  Diagnosis Date  . Hypertension   . Thyroid disease     There are no active problems to display for this patient.   Past Surgical History:  Procedure Laterality Date  . CESAREAN SECTION      OB History    No data available       Home Medications    Prior to Admission medications   Medication Sig Start Date End Date Taking? Authorizing Provider  aspirin 81 MG tablet Take 81 mg by mouth daily.   Yes Historical Provider, MD  levothyroxine (SYNTHROID, LEVOTHROID) 25 MCG tablet Take 1 tablet (25 mcg total) by mouth daily before breakfast. 07/28/16  Yes Mary-Margaret Daphine DeutscherMartin, FNP  lisinopril (PRINIVIL,ZESTRIL) 20 MG tablet Take 1 tablet (20 mg total) by mouth daily. 07/27/16  Yes Mary-Margaret Daphine DeutscherMartin, FNP  amoxicillin (AMOXIL) 250 MG/5ML suspension Take 10 mLs (500 mg total) by mouth 3 (three) times daily. 09/03/16   Mancel BaleElliott Anastyn Ayars, MD    Family History Family History  Problem Relation Age of Onset  . Diabetes Mother   . Heart attack Father     Social History Social History  Substance Use Topics  . Smoking status: Former Smoker    Quit date: 11/21/1973  . Smokeless tobacco: Never Used  . Alcohol use No     Allergies   Sulfa antibiotics   Review of Systems Review of Systems  All other systems reviewed and are negative.    Physical Exam Updated Vital Signs BP 125/67   Pulse 104   Temp 98.3 F (36.8 C)  (Oral)   Resp 17   Ht 5\' 5"  (1.651 m)   Wt 197 lb (89.4 kg)   SpO2 99%   BMI 32.78 kg/m   Physical Exam  Constitutional: She is oriented to person, place, and time. She appears well-developed. No distress.  Elderly, frail  HENT:  Head: Normocephalic and atraumatic.  No pharyngeal swelling or exudate. No trismus.  Eyes: EOM are normal. Pupils are equal, round, and reactive to light.  Left eye injected, with somewhat purulent drainage. No palpable eyelid abscess or swelling. No periorbital swelling.  Neck: Normal range of motion and phonation normal. Neck supple.  Cardiovascular: Normal rate and regular rhythm.   Pulmonary/Chest: Effort normal and breath sounds normal. No stridor. No respiratory distress. She has no wheezes. She has no rales. She exhibits no tenderness.  Abdominal: Soft. She exhibits no distension. There is no tenderness. There is no guarding.  Musculoskeletal: Normal range of motion.  Neurological: She is alert and oriented to person, place, and time. She exhibits normal muscle tone.  Skin: Skin is warm and dry.  Psychiatric: Her behavior is normal. Judgment and thought content normal.  Anxious  Nursing note and vitals reviewed.    ED Treatments / Results  Labs (all labs ordered are listed, but only abnormal results are displayed) Labs Reviewed - No data to  display  EKG  EKG Interpretation None       Radiology No results found.  Procedures Procedures (including critical care time)  Medications Ordered in ED Medications  amoxicillin (AMOXIL) 250 MG/5ML suspension 500 mg (not administered)     Initial Impression / Assessment and Plan / ED Course  I have reviewed the triage vital signs and the nursing notes.  Pertinent labs & imaging results that were available during my care of the patient were reviewed by me and considered in my medical decision making (see chart for details).     Medications  amoxicillin (AMOXIL) 250 MG/5ML suspension 500  mg (not administered)    Patient Vitals for the past 24 hrs:  BP Temp Temp src Pulse Resp SpO2 Height Weight  09/03/16 1728 125/67 98.3 F (36.8 C) Oral 104 17 99 % 5\' 5"  (1.651 m) 197 lb (89.4 kg)    7:33 PM Reevaluation with update and discussion. After initial assessment and treatment, an updated evaluation reveals No change in clinical status. Findings discussed with patient and husband, all questions answered. Tian Mcmurtrey L   Final Clinical Impressions(s) / ED Diagnoses   Final diagnoses:  Acute bacterial conjunctivitis of left eye  Pharyngitis, unspecified etiology     Nonspecific conjunctivitis and pharyngitis, possibly bacterial. Doubt severe bacterial infection, metabolic instability or impending vascular collapse.  Nursing Notes Reviewed/ Care Coordinated Applicable Imaging Reviewed Interpretation of Laboratory Data incorporated into ED treatment  The patient appears reasonably screened and/or stabilized for discharge and I doubt any other medical condition or other [facility] requiring further screening, evaluation, or treatment in the ED at this time prior to discharge.  Plan: Home Medications- continue; Home Treatments- rest; return here if the recommended treatment, does not improve the symptoms; Recommended follow up- PCP prn       New Prescriptions New Prescriptions   AMOXICILLIN (AMOXIL) 250 MG/5ML SUSPENSION    Take 10 mLs (500 mg total) by mouth 3 (three) times daily.     Mancel Bale, MD 09/03/16 [phone]

## 2016-09-03 NOTE — ED Triage Notes (Signed)
Pt c/o sore throat, nasal congestion and drainage, productive cough x 6 days. Denies fever.

## 2016-09-03 NOTE — Discharge Instructions (Signed)
Use warm compresses on left eye every 1-2 hours to help reduce the drainage and discomfort.   Tylenol, for pain or fever.

## 2016-10-30 ENCOUNTER — Ambulatory Visit (INDEPENDENT_AMBULATORY_CARE_PROVIDER_SITE_OTHER): Payer: Medicare Other | Admitting: Nurse Practitioner

## 2016-10-30 ENCOUNTER — Encounter: Payer: Self-pay | Admitting: Nurse Practitioner

## 2016-10-30 VITALS — BP 139/77 | HR 82 | Temp 97.2°F | Ht 65.0 in | Wt 197.0 lb

## 2016-10-30 DIAGNOSIS — I10 Essential (primary) hypertension: Secondary | ICD-10-CM | POA: Diagnosis not present

## 2016-10-30 DIAGNOSIS — Z6832 Body mass index (BMI) 32.0-32.9, adult: Secondary | ICD-10-CM | POA: Diagnosis not present

## 2016-10-30 DIAGNOSIS — E039 Hypothyroidism, unspecified: Secondary | ICD-10-CM

## 2016-10-30 MED ORDER — LEVOTHYROXINE SODIUM 25 MCG PO TABS: 25.0000 ug | ORAL_TABLET | Freq: Every day | ORAL | 5 refills | Status: DC

## 2016-10-30 MED ORDER — LISINOPRIL 20 MG PO TABS: 20.0000 mg | ORAL_TABLET | Freq: Every day | ORAL | 1 refills | Status: DC

## 2016-10-30 NOTE — Patient Instructions (Signed)
DASH Eating Plan DASH stands for "Dietary Approaches to Stop Hypertension." The DASH eating plan is a healthy eating plan that has been shown to reduce high blood pressure (hypertension). It may also reduce your risk for type 2 diabetes, heart disease, and stroke. The DASH eating plan may also help with weight loss. What are tips for following this plan? General guidelines  Avoid eating more than 2,300 mg (milligrams) of salt (sodium) a day. If you have hypertension, you may need to reduce your sodium intake to 1,500 mg a day.  Limit alcohol intake to no more than 1 drink a day for nonpregnant women and 2 drinks a day for men. One drink equals 12 oz of beer, 5 oz of wine, or 1 oz of hard liquor.  Work with your health care provider to maintain a healthy body weight or to lose weight. Ask what an ideal weight is for you.  Get at least 30 minutes of exercise that causes your heart to beat faster (aerobic exercise) most days of the week. Activities may include walking, swimming, or biking.  Work with your health care provider or diet and nutrition specialist (dietitian) to adjust your eating plan to your individual calorie needs. Reading food labels  Check food labels for the amount of sodium per serving. Choose foods with less than 5 percent of the Daily Value of sodium. Generally, foods with less than 300 mg of sodium per serving fit into this eating plan.  To find whole grains, look for the word "whole" as the first word in the ingredient list. Shopping  Buy products labeled as "low-sodium" or "no salt added."  Buy fresh foods. Avoid canned foods and premade or frozen meals. Cooking  Avoid adding salt when cooking. Use salt-free seasonings or herbs instead of table salt or sea salt. Check with your health care provider or pharmacist before using salt substitutes.  Do not fry foods. Cook foods using healthy methods such as baking, boiling, grilling, and broiling instead.  Cook with  heart-healthy oils, such as olive, canola, soybean, or sunflower oil. Meal planning   Eat a balanced diet that includes: ? 5 or more servings of fruits and vegetables each day. At each meal, try to fill half of your plate with fruits and vegetables. ? Up to 6-8 servings of whole grains each day. ? Less than 6 oz of lean meat, poultry, or fish each day. A 3-oz serving of meat is about the same size as a deck of cards. One egg equals 1 oz. ? 2 servings of low-fat dairy each day. ? A serving of nuts, seeds, or beans 5 times each week. ? Heart-healthy fats. Healthy fats called Omega-3 fatty acids are found in foods such as flaxseeds and coldwater fish, like sardines, salmon, and mackerel.  Limit how much you eat of the following: ? Canned or prepackaged foods. ? Food that is high in trans fat, such as fried foods. ? Food that is high in saturated fat, such as fatty meat. ? Sweets, desserts, sugary drinks, and other foods with added sugar. ? Full-fat dairy products.  Do not salt foods before eating.  Try to eat at least 2 vegetarian meals each week.  Eat more home-cooked food and less restaurant, buffet, and fast food.  When eating at a restaurant, ask that your food be prepared with less salt or no salt, if possible. What foods are recommended? The items listed may not be a complete list. Talk with your dietitian about what   dietary choices are best for you. Grains Whole-grain or whole-wheat bread. Whole-grain or whole-wheat pasta. Brown rice. Oatmeal. Quinoa. Bulgur. Whole-grain and low-sodium cereals. Pita bread. Low-fat, low-sodium crackers. Whole-wheat flour tortillas. Vegetables Fresh or frozen vegetables (raw, steamed, roasted, or grilled). Low-sodium or reduced-sodium tomato and vegetable juice. Low-sodium or reduced-sodium tomato sauce and tomato paste. Low-sodium or reduced-sodium canned vegetables. Fruits All fresh, dried, or frozen fruit. Canned fruit in natural juice (without  added sugar). Meat and other protein foods Skinless chicken or turkey. Ground chicken or turkey. Pork with fat trimmed off. Fish and seafood. Egg whites. Dried beans, peas, or lentils. Unsalted nuts, nut butters, and seeds. Unsalted canned beans. Lean cuts of beef with fat trimmed off. Low-sodium, lean deli meat. Dairy Low-fat (1%) or fat-free (skim) milk. Fat-free, low-fat, or reduced-fat cheeses. Nonfat, low-sodium ricotta or cottage cheese. Low-fat or nonfat yogurt. Low-fat, low-sodium cheese. Fats and oils Soft margarine without trans fats. Vegetable oil. Low-fat, reduced-fat, or light mayonnaise and salad dressings (reduced-sodium). Canola, safflower, olive, soybean, and sunflower oils. Avocado. Seasoning and other foods Herbs. Spices. Seasoning mixes without salt. Unsalted popcorn and pretzels. Fat-free sweets. What foods are not recommended? The items listed may not be a complete list. Talk with your dietitian about what dietary choices are best for you. Grains Baked goods made with fat, such as croissants, muffins, or some breads. Dry pasta or rice meal packs. Vegetables Creamed or fried vegetables. Vegetables in a cheese sauce. Regular canned vegetables (not low-sodium or reduced-sodium). Regular canned tomato sauce and paste (not low-sodium or reduced-sodium). Regular tomato and vegetable juice (not low-sodium or reduced-sodium). Pickles. Olives. Fruits Canned fruit in a light or heavy syrup. Fried fruit. Fruit in cream or butter sauce. Meat and other protein foods Fatty cuts of meat. Ribs. Fried meat. Bacon. Sausage. Bologna and other processed lunch meats. Salami. Fatback. Hotdogs. Bratwurst. Salted nuts and seeds. Canned beans with added salt. Canned or smoked fish. Whole eggs or egg yolks. Chicken or turkey with skin. Dairy Whole or 2% milk, cream, and half-and-half. Whole or full-fat cream cheese. Whole-fat or sweetened yogurt. Full-fat cheese. Nondairy creamers. Whipped toppings.  Processed cheese and cheese spreads. Fats and oils Butter. Stick margarine. Lard. Shortening. Ghee. Bacon fat. Tropical oils, such as coconut, palm kernel, or palm oil. Seasoning and other foods Salted popcorn and pretzels. Onion salt, garlic salt, seasoned salt, table salt, and sea salt. Worcestershire sauce. Tartar sauce. Barbecue sauce. Teriyaki sauce. Soy sauce, including reduced-sodium. Steak sauce. Canned and packaged gravies. Fish sauce. Oyster sauce. Cocktail sauce. Horseradish that you find on the shelf. Ketchup. Mustard. Meat flavorings and tenderizers. Bouillon cubes. Hot sauce and Tabasco sauce. Premade or packaged marinades. Premade or packaged taco seasonings. Relishes. Regular salad dressings. Where to find more information:  National Heart, Lung, and Blood Institute: www.nhlbi.nih.gov  American Heart Association: www.heart.org Summary  The DASH eating plan is a healthy eating plan that has been shown to reduce high blood pressure (hypertension). It may also reduce your risk for type 2 diabetes, heart disease, and stroke.  With the DASH eating plan, you should limit salt (sodium) intake to 2,300 mg a day. If you have hypertension, you may need to reduce your sodium intake to 1,500 mg a day.  When on the DASH eating plan, aim to eat more fresh fruits and vegetables, whole grains, lean proteins, low-fat dairy, and heart-healthy fats.  Work with your health care provider or diet and nutrition specialist (dietitian) to adjust your eating plan to your individual   calorie needs. This information is not intended to replace advice given to you by your health care provider. Make sure you discuss any questions you have with your health care provider. Document Released: 07/23/2011 Document Revised: 07/27/2016 Document Reviewed: 07/27/2016 Elsevier Interactive Patient Education  2017 Elsevier Inc.  

## 2016-10-30 NOTE — Progress Notes (Signed)
Subjective:    Patient ID: Meredith Ruiz, female    DOB: [birth year], 71 y.o.   MRN: [id number]   Patient here today for follow up of chronic medical problems. NO changes since last visit. NO complaints today.   Outpatient Encounter Prescriptions as of 07/27/2016  Medication Sig  . aspirin 81 MG tablet Take 81 mg by mouth daily.  Marland Kitchen levothyroxine (SYNTHROID, LEVOTHROID) 50 MCG tablet TAKE ONE TABLET BY MOUTH ONCE DAILY BEFORE BREAKFAST  . lisinopril (PRINIVIL,ZESTRIL) 10 MG tablet Take 1 tablet (10 mg total) by mouth daily.    Hypertension  This is a recurrent problem. Episode onset: dx 6 months ago. The problem has been gradually improving since onset. The problem is controlled. Pertinent negatives include no anxiety, chest pain, headaches, palpitations or shortness of breath. Risk factors for coronary artery disease include obesity, post-menopausal state and sedentary lifestyle. Past treatments include ACE inhibitors. The current treatment provides significant improvement. Compliance problems include diet and exercise.  There is no history of CAD/MI or retinopathy. Identifiable causes of hypertension include a thyroid problem.  Thyroid Problem  Presents for follow-up (hypohyroidism) visit. Patient reports no palpitations. The symptoms have been stable.       Review of Systems  Constitutional: Negative.   HENT: Negative.   Respiratory: Negative for shortness of breath.   Cardiovascular: Negative for chest pain and palpitations.  Gastrointestinal: Negative.   Genitourinary: Negative.   Musculoskeletal: Negative.   Neurological: Negative for headaches.  Psychiatric/Behavioral: Negative.   All other systems reviewed and are negative.      Objective:   Physical Exam  Constitutional: She is oriented to person, place, and time. She appears well-developed and well-nourished.  HENT:  Nose: Nose normal.  Mouth/Throat: Oropharynx is clear and moist.  Eyes: EOM are normal.  Neck:  Trachea normal, normal range of motion and full passive range of motion without pain. Neck supple. No JVD present. Carotid bruit is not present. No thyromegaly present.  Cardiovascular: Normal rate, regular rhythm, normal heart sounds and intact distal pulses.  Exam reveals no gallop and no friction rub.   No murmur heard. Pulmonary/Chest: Effort normal and breath sounds normal.  Abdominal: Soft. Bowel sounds are normal. She exhibits no distension and no mass. There is no tenderness.  Musculoskeletal: Normal range of motion.  Lymphadenopathy:    She has no cervical adenopathy.  Neurological: She is alert and oriented to person, place, and time. She has normal reflexes.  Skin: Skin is warm and dry.  Psychiatric: She has a normal mood and affect. Her behavior is normal. Judgment and thought content normal.   BP 139/77   Pulse 82   Temp 97.2 F (36.2 C) (Oral)   Ht '5\' 5"'  (1.651 m)   Wt 197 lb (89.4 kg)   BMI 32.78 kg/m       Assessment & Plan:  1. Acquired hypothyroidism - levothyroxine (SYNTHROID, LEVOTHROID) 25 MCG tablet; Take 1 tablet (25 mcg total) by mouth daily before breakfast.  Dispense: 30 tablet; Refill: 5 - Thyroid Panel With TSH  2. Essential hypertension Low sodium diet - lisinopril (PRINIVIL,ZESTRIL) 20 MG tablet; Take 1 tablet (20 mg total) by mouth daily.  Dispense: 90 tablet; Refill: 1 - CMP14+EGFR - Lipid panel  3. BMI 32.0-32.9,adult Discussed diet and exercise for person with BMI >25 Will recheck weight in 3-6 months   Patient  Scheduled mammogram Labs pending Health maintenance reviewed Diet and exercise encouraged Continue all meds Follow up  In 6  months   Mary-Margaret Hassell Done, FNP

## 2016-10-31 LAB — CMP14+EGFR
ALBUMIN: 3.9 g/dL (ref 3.6–4.8)
ALT: 11 IU/L (ref 0–32)
AST: 17 IU/L (ref 0–40)
Albumin/Globulin Ratio: 1.3 (ref 1.2–2.2)
Alkaline Phosphatase: 125 IU/L — ABNORMAL HIGH (ref 39–117)
BUN/Creatinine Ratio: 21 (ref 12–28)
BUN: 19 mg/dL (ref 8–27)
Bilirubin Total: 0.3 mg/dL (ref 0.0–1.2)
CALCIUM: 9.3 mg/dL (ref 8.7–10.3)
CO2: 22 mmol/L (ref 18–29)
Chloride: 104 mmol/L (ref 96–106)
Creatinine, Ser: 0.9 mg/dL (ref 0.57–1.00)
GFR calc non Af Amer: 65 mL/min/{1.73_m2} (ref >59)
GFR, EST AFRICAN AMERICAN: 75 mL/min/{1.73_m2} (ref >59)
GLOBULIN, TOTAL: 2.9 g/dL (ref 1.5–4.5)
GLUCOSE: 104 mg/dL — AB (ref 65–99)
POTASSIUM: 4.1 mmol/L (ref 3.5–5.2)
Sodium: 142 mmol/L (ref 134–144)
Total Protein: 6.8 g/dL (ref 6.0–8.5)

## 2016-10-31 LAB — LIPID PANEL
CHOL/HDL RATIO: 4.1 ratio (ref 0.0–4.4)
Cholesterol, Total: 189 mg/dL (ref 100–199)
HDL: 46 mg/dL (ref >39)
LDL Calculated: 132 mg/dL — ABNORMAL HIGH (ref 0–99)
Triglycerides: 54 mg/dL (ref 0–149)
VLDL Cholesterol Cal: 11 mg/dL (ref 5–40)

## 2016-10-31 LAB — THYROID PANEL WITH TSH
FREE THYROXINE INDEX: 1.7 (ref 1.2–4.9)
T3 Uptake Ratio: 25 % (ref 24–39)
T4, Total: 6.8 ug/dL (ref 4.5–12.0)
TSH: 3.78 u[IU]/mL (ref 0.450–4.500)

## 2017-01-21 ENCOUNTER — Other Ambulatory Visit: Payer: Self-pay | Admitting: *Deleted

## 2017-01-21 NOTE — Telephone Encounter (Signed)
Patient needs to be seen.

## 2017-02-09 NOTE — Telephone Encounter (Signed)
Detailed message left for patient that she needs to be seen.  

## 2017-02-10 ENCOUNTER — Encounter: Payer: Medicare Other | Admitting: *Deleted

## 2017-04-14 DIAGNOSIS — Z1231 Encounter for screening mammogram for malignant neoplasm of breast: Secondary | ICD-10-CM | POA: Diagnosis not present

## 2017-05-03 ENCOUNTER — Ambulatory Visit: Payer: Medicare Other | Admitting: Nurse Practitioner

## 2017-08-03 ENCOUNTER — Other Ambulatory Visit: Payer: Self-pay | Admitting: Nurse Practitioner

## 2017-08-03 DIAGNOSIS — I10 Essential (primary) hypertension: Secondary | ICD-10-CM

## 2017-08-03 NOTE — Telephone Encounter (Signed)
Last refill without being seen 

## 2017-08-03 NOTE — Telephone Encounter (Signed)
Last seen 10/30/16  MMM 

## 2017-09-21 ENCOUNTER — Other Ambulatory Visit: Payer: Self-pay | Admitting: Nurse Practitioner

## 2017-09-21 NOTE — Telephone Encounter (Signed)
What symptoms do you have? Rash under breast  How long have you been sick? A week ago  Have you been seen for this problem? Yes-pt said that she was prescribed a cream that worked really well and she was the same rx. I did not see the rx on her med list.  If your provider decides to give you a prescription, which pharmacy would you like for it to be sent to? [facility]   Patient informed that this information will be sent to the clinical staff for review and that they should receive a follow up call.

## 2017-09-21 NOTE — Telephone Encounter (Signed)
Please review and advise.

## 2017-09-23 NOTE — Telephone Encounter (Signed)
Who rx this I cannot find it on chart

## 2017-10-07 DIAGNOSIS — Z1283 Encounter for screening for malignant neoplasm of skin: Secondary | ICD-10-CM | POA: Diagnosis not present

## 2017-10-07 DIAGNOSIS — L304 Erythema intertrigo: Secondary | ICD-10-CM | POA: Diagnosis not present

## 2017-10-11 NOTE — Telephone Encounter (Signed)
Message left to please call back if still needs

## 2017-11-06 ENCOUNTER — Other Ambulatory Visit: Payer: Self-pay | Admitting: Nurse Practitioner

## 2017-11-06 DIAGNOSIS — I10 Essential (primary) hypertension: Secondary | ICD-10-CM

## 2017-11-08 ENCOUNTER — Other Ambulatory Visit: Payer: Self-pay | Admitting: Nurse Practitioner

## 2017-11-08 DIAGNOSIS — I10 Essential (primary) hypertension: Secondary | ICD-10-CM

## 2017-11-18 ENCOUNTER — Other Ambulatory Visit: Payer: Self-pay | Admitting: Nurse Practitioner

## 2017-11-18 ENCOUNTER — Encounter: Payer: Self-pay | Admitting: Nurse Practitioner

## 2017-11-18 ENCOUNTER — Ambulatory Visit (INDEPENDENT_AMBULATORY_CARE_PROVIDER_SITE_OTHER): Payer: Medicare Other | Admitting: Nurse Practitioner

## 2017-11-18 VITALS — BP 133/77 | HR 78 | Temp 96.9°F | Ht 65.0 in | Wt 195.0 lb

## 2017-11-18 DIAGNOSIS — E039 Hypothyroidism, unspecified: Secondary | ICD-10-CM

## 2017-11-18 DIAGNOSIS — Z1212 Encounter for screening for malignant neoplasm of rectum: Secondary | ICD-10-CM

## 2017-11-18 DIAGNOSIS — I10 Essential (primary) hypertension: Secondary | ICD-10-CM | POA: Diagnosis not present

## 2017-11-18 DIAGNOSIS — Z1211 Encounter for screening for malignant neoplasm of colon: Secondary | ICD-10-CM

## 2017-11-18 DIAGNOSIS — Z6832 Body mass index (BMI) 32.0-32.9, adult: Secondary | ICD-10-CM

## 2017-11-18 MED ORDER — LISINOPRIL 20 MG PO TABS: 20.0000 mg | ORAL_TABLET | Freq: Every day | ORAL | 1 refills | Status: DC

## 2017-11-18 MED ORDER — SYNTHROID 25 MCG PO TABS: 25.0000 ug | ORAL_TABLET | Freq: Every day | ORAL | 5 refills | Status: DC

## 2017-11-18 NOTE — Patient Instructions (Signed)
Hypothyroidism Hypothyroidism is a disorder of the thyroid. The thyroid is a large gland that is located in the lower front of the neck. The thyroid releases hormones that control how the body works. With hypothyroidism, the thyroid does not make enough of these hormones. What are the causes? Causes of hypothyroidism may include:  Viral infections.  Pregnancy.  Your own defense system (immune system) attacking your thyroid.  Certain medicines.  Birth defects.  Past radiation treatments to your head or neck.  Past treatment with radioactive iodine.  Past surgical removal of part or all of your thyroid.  Problems with the gland that is located in the center of your brain (pituitary).  What are the signs or symptoms? Signs and symptoms of hypothyroidism may include:  Feeling as though you have no energy (lethargy).  Inability to tolerate cold.  Weight gain that is not explained by a change in diet or exercise habits.  Dry skin.  Coarse hair.  Menstrual irregularity.  Slowing of thought processes.  Constipation.  Sadness or depression.  How is this diagnosed? Your health care provider may diagnose hypothyroidism with blood tests and ultrasound tests. How is this treated? Hypothyroidism is treated with medicine that replaces the hormones that your body does not make. After you begin treatment, it may take several weeks for symptoms to go away. Follow these instructions at home:  Take medicines only as directed by your health care provider.  If you start taking any new medicines, tell your health care provider.  Keep all follow-up visits as directed by your health care provider. This is important. As your condition improves, your dosage needs may change. You will need to have blood tests regularly so that your health care provider can watch your condition. Contact a health care provider if:  Your symptoms do not get better with treatment.  You are taking thyroid  replacement medicine and: ? You sweat excessively. ? You have tremors. ? You feel anxious. ? You lose weight rapidly. ? You cannot tolerate heat. ? You have emotional swings. ? You have diarrhea. ? You feel weak. Get help right away if:  You develop chest pain.  You develop an irregular heartbeat.  You develop a rapid heartbeat. This information is not intended to replace advice given to you by your health care provider. Make sure you discuss any questions you have with your health care provider. Document Released: 08/03/2005 Document Revised: 01/09/2016 Document Reviewed: 12/19/2013 Elsevier Interactive Patient Education  2018 Elsevier Inc.  

## 2017-11-18 NOTE — Progress Notes (Signed)
Subjective:    Patient ID: Meredith Ruiz, female    DOB: 1946/05/20, 72 y.o.   MRN: [id number]  HPI  Meredith Ruiz is here today for follow up of chronic medical problem.  Outpatient Encounter Medications as of 11/18/2017  Medication Sig  . aspirin 81 MG tablet Take 81 mg by mouth daily.  Marland Kitchen levothyroxine (SYNTHROID, LEVOTHROID) 25 MCG tablet Take 1 tablet (25 mcg total) by mouth daily before breakfast.  . lisinopril (PRINIVIL,ZESTRIL) 20 MG tablet TAKE 1 TABLET BY MOUTH ONCE DAILY     1. Essential hypertension  No c/o chest pain, sob or headache. Does not check blood pressure at home.  2. Acquired hypothyroidism  No problems that she is aware of. She stopped taking because she heard that it caused cancer. She said her son looked it up  On the internet and said that yes it did cause cancer.  3. BMI 32.0-32.9,adult  No recent weighty changes    New complaints: None today  Social history: Retired- lives with her sons    Review of Systems  Constitutional: Negative for activity change and appetite change.  HENT: Negative.   Eyes: Negative for pain.  Respiratory: Negative for shortness of breath.   Cardiovascular: Negative for chest pain, palpitations and leg swelling.  Gastrointestinal: Negative for abdominal pain.  Endocrine: Negative for polydipsia.  Genitourinary: Negative.   Skin: Negative for rash.  Neurological: Negative for dizziness, weakness and headaches.  Hematological: Does not bruise/bleed easily.  Psychiatric/Behavioral: Negative.   All other systems reviewed and are negative.      Objective:   Physical Exam  Constitutional: She is oriented to person, place, and time. She appears well-developed and well-nourished.  HENT:  Nose: Nose normal.  Mouth/Throat: Oropharynx is clear and moist.  Eyes: EOM are normal.  Neck: Trachea normal, normal range of motion and full passive range of motion without pain. Neck supple. No JVD present. Carotid bruit is not  present. No thyromegaly present.  Cardiovascular: Normal rate, regular rhythm, normal heart sounds and intact distal pulses. Exam reveals no gallop and no friction rub.  No murmur heard. Pulmonary/Chest: Effort normal and breath sounds normal.  Abdominal: Soft. Bowel sounds are normal. She exhibits no distension and no mass. There is no tenderness.  Musculoskeletal: Normal range of motion.  Lymphadenopathy:    She has no cervical adenopathy.  Neurological: She is alert and oriented to person, place, and time. She has normal reflexes.  Skin: Skin is warm and dry.  Psychiatric: She has a normal mood and affect. Her behavior is normal. Judgment and thought content normal.   BP 133/77   Pulse 78   Temp (!) 96.9 F (36.1 C) (Oral)   Ht 5' 5" (1.651 m)   Wt 195 lb (88.5 kg)   BMI 32.45 kg/m        Assessment & Plan:  1. Essential hypertension Low sodium diet - CMP14+EGFR - Lipid panel - lisinopril (PRINIVIL,ZESTRIL) 20 MG tablet; Take 1 tablet (20 mg total) by mouth daily.  Dispense: 90 tablet; Refill: 1  2. Acquired hypothyroidism Had long discussion about patient needing to be on thyroid replacement. Will try synthrod and see how expensive it is. If is to high will try armor thyroid. - Thyroid Panel With TSH - SYNTHROID 25 MCG tablet; Take 1 tablet (25 mcg total) by mouth daily before breakfast.  Dispense: 30 tablet; Refill: 5  3. BMI 32.0-32.9,adult Discussed diet and exercise for person with BMI >25 Will  recheck weight in 3-6 months  4. Screening for colorectal cancer Will be mailed to patient - Cologuard    Labs pending Health maintenance reviewed Diet and exercise encouraged Continue all meds Follow up  In 3 months   [place], FNP

## 2017-11-19 LAB — THYROID PANEL WITH TSH
FREE THYROXINE INDEX: 1.6 (ref 1.2–4.9)
T3 UPTAKE RATIO: 25 % (ref 24–39)
T4 TOTAL: 6.5 ug/dL (ref 4.5–12.0)
TSH: 5.74 u[IU]/mL — ABNORMAL HIGH (ref 0.450–4.500)

## 2017-11-19 LAB — LIPID PANEL
CHOL/HDL RATIO: 3.8 ratio (ref 0.0–4.4)
Cholesterol, Total: 188 mg/dL (ref 100–199)
HDL: 49 mg/dL (ref >39)
LDL Calculated: 124 mg/dL — ABNORMAL HIGH (ref 0–99)
TRIGLYCERIDES: 74 mg/dL (ref 0–149)
VLDL Cholesterol Cal: 15 mg/dL (ref 5–40)

## 2017-11-19 LAB — CMP14+EGFR
ALT: 13 IU/L (ref 0–32)
AST: 13 IU/L (ref 0–40)
Albumin/Globulin Ratio: 1.6 (ref 1.2–2.2)
Albumin: 4.2 g/dL (ref 3.5–4.8)
Alkaline Phosphatase: 145 IU/L — ABNORMAL HIGH (ref 39–117)
BUN/Creatinine Ratio: 20 (ref 12–28)
BUN: 20 mg/dL (ref 8–27)
Bilirubin Total: 0.6 mg/dL (ref 0.0–1.2)
CALCIUM: 9.4 mg/dL (ref 8.7–10.3)
CHLORIDE: 103 mmol/L (ref 96–106)
CO2: 25 mmol/L (ref 20–29)
Creatinine, Ser: 0.98 mg/dL (ref 0.57–1.00)
GFR, EST AFRICAN AMERICAN: 68 mL/min/{1.73_m2} (ref >59)
GFR, EST NON AFRICAN AMERICAN: 59 mL/min/{1.73_m2} — AB (ref >59)
GLUCOSE: 104 mg/dL — AB (ref 65–99)
Globulin, Total: 2.7 g/dL (ref 1.5–4.5)
POTASSIUM: 3.7 mmol/L (ref 3.5–5.2)
Sodium: 142 mmol/L (ref 134–144)
TOTAL PROTEIN: 6.9 g/dL (ref 6.0–8.5)

## 2018-01-11 ENCOUNTER — Encounter: Payer: Self-pay | Admitting: Nurse Practitioner

## 2018-01-11 ENCOUNTER — Ambulatory Visit (INDEPENDENT_AMBULATORY_CARE_PROVIDER_SITE_OTHER): Payer: Medicare Other | Admitting: Nurse Practitioner

## 2018-01-11 VITALS — BP 132/84 | HR 81 | Temp 97.0°F | Ht 65.0 in | Wt 195.0 lb

## 2018-01-11 DIAGNOSIS — Z6832 Body mass index (BMI) 32.0-32.9, adult: Secondary | ICD-10-CM | POA: Diagnosis not present

## 2018-01-11 DIAGNOSIS — Z01419 Encounter for gynecological examination (general) (routine) without abnormal findings: Secondary | ICD-10-CM

## 2018-01-11 DIAGNOSIS — E039 Hypothyroidism, unspecified: Secondary | ICD-10-CM | POA: Diagnosis not present

## 2018-01-11 DIAGNOSIS — I10 Essential (primary) hypertension: Secondary | ICD-10-CM | POA: Diagnosis not present

## 2018-01-11 NOTE — Addendum Note (Signed)
Addended by: Bennie Pierini on: 01/11/2018 09:50 AM   Modules accepted: Orders

## 2018-01-11 NOTE — Patient Instructions (Signed)
DASH Eating Plan DASH stands for "Dietary Approaches to Stop Hypertension." The DASH eating plan is a healthy eating plan that has been shown to reduce high blood pressure (hypertension). It may also reduce your risk for type 2 diabetes, heart disease, and stroke. The DASH eating plan may also help with weight loss. What are tips for following this plan? General guidelines  Avoid eating more than 2,300 mg (milligrams) of salt (sodium) a day. If you have hypertension, you may need to reduce your sodium intake to 1,500 mg a day.  Limit alcohol intake to no more than 1 drink a day for nonpregnant women and 2 drinks a day for men. One drink equals 12 oz of beer, 5 oz of wine, or 1 oz of hard liquor.  Work with your health care provider to maintain a healthy body weight or to lose weight. Ask what an ideal weight is for you.  Get at least 30 minutes of exercise that causes your heart to beat faster (aerobic exercise) most days of the week. Activities may include walking, swimming, or biking.  Work with your health care provider or diet and nutrition specialist (dietitian) to adjust your eating plan to your individual calorie needs. Reading food labels  Check food labels for the amount of sodium per serving. Choose foods with less than 5 percent of the Daily Value of sodium. Generally, foods with less than 300 mg of sodium per serving fit into this eating plan.  To find whole grains, look for the word "whole" as the first word in the ingredient list. Shopping  Buy products labeled as "low-sodium" or "no salt added."  Buy fresh foods. Avoid canned foods and premade or frozen meals. Cooking  Avoid adding salt when cooking. Use salt-free seasonings or herbs instead of table salt or sea salt. Check with your health care provider or pharmacist before using salt substitutes.  Do not fry foods. Cook foods using healthy methods such as baking, boiling, grilling, and broiling instead.  Cook with  heart-healthy oils, such as olive, canola, soybean, or sunflower oil. Meal planning   Eat a balanced diet that includes: ? 5 or more servings of fruits and vegetables each day. At each meal, try to fill half of your plate with fruits and vegetables. ? Up to 6-8 servings of whole grains each day. ? Less than 6 oz of lean meat, poultry, or fish each day. A 3-oz serving of meat is about the same size as a deck of cards. One egg equals 1 oz. ? 2 servings of low-fat dairy each day. ? A serving of nuts, seeds, or beans 5 times each week. ? Heart-healthy fats. Healthy fats called Omega-3 fatty acids are found in foods such as flaxseeds and coldwater fish, like sardines, salmon, and mackerel.  Limit how much you eat of the following: ? Canned or prepackaged foods. ? Food that is high in trans fat, such as fried foods. ? Food that is high in saturated fat, such as fatty meat. ? Sweets, desserts, sugary drinks, and other foods with added sugar. ? Full-fat dairy products.  Do not salt foods before eating.  Try to eat at least 2 vegetarian meals each week.  Eat more home-cooked food and less restaurant, buffet, and fast food.  When eating at a restaurant, ask that your food be prepared with less salt or no salt, if possible. What foods are recommended? The items listed may not be a complete list. Talk with your dietitian about what   dietary choices are best for you. Grains Whole-grain or whole-wheat bread. Whole-grain or whole-wheat pasta. Brown rice. Oatmeal. Quinoa. Bulgur. Whole-grain and low-sodium cereals. Pita bread. Low-fat, low-sodium crackers. Whole-wheat flour tortillas. Vegetables Fresh or frozen vegetables (raw, steamed, roasted, or grilled). Low-sodium or reduced-sodium tomato and vegetable juice. Low-sodium or reduced-sodium tomato sauce and tomato paste. Low-sodium or reduced-sodium canned vegetables. Fruits All fresh, dried, or frozen fruit. Canned fruit in natural juice (without  added sugar). Meat and other protein foods Skinless chicken or turkey. Ground chicken or turkey. Pork with fat trimmed off. Fish and seafood. Egg whites. Dried beans, peas, or lentils. Unsalted nuts, nut butters, and seeds. Unsalted canned beans. Lean cuts of beef with fat trimmed off. Low-sodium, lean deli meat. Dairy Low-fat (1%) or fat-free (skim) milk. Fat-free, low-fat, or reduced-fat cheeses. Nonfat, low-sodium ricotta or cottage cheese. Low-fat or nonfat yogurt. Low-fat, low-sodium cheese. Fats and oils Soft margarine without trans fats. Vegetable oil. Low-fat, reduced-fat, or light mayonnaise and salad dressings (reduced-sodium). Canola, safflower, olive, soybean, and sunflower oils. Avocado. Seasoning and other foods Herbs. Spices. Seasoning mixes without salt. Unsalted popcorn and pretzels. Fat-free sweets. What foods are not recommended? The items listed may not be a complete list. Talk with your dietitian about what dietary choices are best for you. Grains Baked goods made with fat, such as croissants, muffins, or some breads. Dry pasta or rice meal packs. Vegetables Creamed or fried vegetables. Vegetables in a cheese sauce. Regular canned vegetables (not low-sodium or reduced-sodium). Regular canned tomato sauce and paste (not low-sodium or reduced-sodium). Regular tomato and vegetable juice (not low-sodium or reduced-sodium). Pickles. Olives. Fruits Canned fruit in a light or heavy syrup. Fried fruit. Fruit in cream or butter sauce. Meat and other protein foods Fatty cuts of meat. Ribs. Fried meat. Bacon. Sausage. Bologna and other processed lunch meats. Salami. Fatback. Hotdogs. Bratwurst. Salted nuts and seeds. Canned beans with added salt. Canned or smoked fish. Whole eggs or egg yolks. Chicken or turkey with skin. Dairy Whole or 2% milk, cream, and half-and-half. Whole or full-fat cream cheese. Whole-fat or sweetened yogurt. Full-fat cheese. Nondairy creamers. Whipped toppings.  Processed cheese and cheese spreads. Fats and oils Butter. Stick margarine. Lard. Shortening. Ghee. Bacon fat. Tropical oils, such as coconut, palm kernel, or palm oil. Seasoning and other foods Salted popcorn and pretzels. Onion salt, garlic salt, seasoned salt, table salt, and sea salt. Worcestershire sauce. Tartar sauce. Barbecue sauce. Teriyaki sauce. Soy sauce, including reduced-sodium. Steak sauce. Canned and packaged gravies. Fish sauce. Oyster sauce. Cocktail sauce. Horseradish that you find on the shelf. Ketchup. Mustard. Meat flavorings and tenderizers. Bouillon cubes. Hot sauce and Tabasco sauce. Premade or packaged marinades. Premade or packaged taco seasonings. Relishes. Regular salad dressings. Where to find more information:  National Heart, Lung, and Blood Institute: www.nhlbi.nih.gov  American Heart Association: www.heart.org Summary  The DASH eating plan is a healthy eating plan that has been shown to reduce high blood pressure (hypertension). It may also reduce your risk for type 2 diabetes, heart disease, and stroke.  With the DASH eating plan, you should limit salt (sodium) intake to 2,300 mg a day. If you have hypertension, you may need to reduce your sodium intake to 1,500 mg a day.  When on the DASH eating plan, aim to eat more fresh fruits and vegetables, whole grains, lean proteins, low-fat dairy, and heart-healthy fats.  Work with your health care provider or diet and nutrition specialist (dietitian) to adjust your eating plan to your individual   calorie needs. This information is not intended to replace advice given to you by your health care provider. Make sure you discuss any questions you have with your health care provider. Document Released: 07/23/2011 Document Revised: 07/27/2016 Document Reviewed: 07/27/2016 Elsevier Interactive Patient Education  2018 Elsevier Inc.  

## 2018-01-11 NOTE — Progress Notes (Signed)
Subjective:    Patient ID: Meredith Ruiz, female    DOB: 1946-01-28, 72 y.o.   MRN: [id number]   Chief Complaint: Medical management of chronic issues and pap.  HPI:  1. Annual physical exam   2. Gynecologic exam normal  Last pap was normal  3. Essential hypertension  No c/o chest pain, sob or headache. Does not check blood pressure at home BP Readings from Last 3 Encounters:  01/11/18 132/84  11/18/17 133/77  10/30/16 139/77     4. Acquired hypothyroidism  Not having any problems that she is aware of  5. BMI 32.0-32.9,adult  No recent weight changes    Outpatient Encounter Medications as of 01/11/2018  Medication Sig  . aspirin 81 MG tablet Take 81 mg by mouth daily.  Marland Kitchen levothyroxine (SYNTHROID, LEVOTHROID) 25 MCG tablet TAKE 1 TABLET BY MOUTH ONCE DAILY BEFORE BREAKFAST  . lisinopril (PRINIVIL,ZESTRIL) 20 MG tablet Take 1 tablet (20 mg total) by mouth daily.  Marland Kitchen SYNTHROID 25 MCG tablet Take 1 tablet (25 mcg total) by mouth daily before breakfast.      New complaints: None today  Social history: Her 2 children still live with her. She is retired from [employer]: Negative for activity change and appetite change.  HENT: Negative.   Eyes: Negative for pain.  Respiratory: Negative for shortness of breath.   Cardiovascular: Negative for chest pain, palpitations and leg swelling.  Gastrointestinal: Negative for abdominal pain.  Endocrine: Negative for polydipsia.  Genitourinary: Negative.   Skin: Negative for rash.  Neurological: Negative for dizziness, weakness and headaches.  Hematological: Does not bruise/bleed easily.  Psychiatric/Behavioral: Negative.   All other systems reviewed and are negative.      Objective:   Physical Exam  Constitutional: She is oriented to person, place, and time.  HENT:  Head: Normocephalic.  Nose: Nose normal.  Mouth/Throat: Oropharynx is clear and moist.  Eyes: Pupils are equal,  round, and reactive to light. EOM are normal.  Neck: Normal range of motion. Neck supple. No JVD present. Carotid bruit is not present.  Cardiovascular: Normal rate, regular rhythm, normal heart sounds and intact distal pulses.  Pulmonary/Chest: Effort normal and breath sounds normal. No respiratory distress. She has no wheezes. She has no rales. She exhibits no tenderness.  Abdominal: Soft. Normal appearance, normal aorta and bowel sounds are normal. She exhibits no distension, no abdominal bruit, no pulsatile midline mass and no mass. There is no splenomegaly or hepatomegaly. There is no tenderness.  Genitourinary: Vagina normal and uterus normal. No vaginal discharge found.  Genitourinary Comments: No adnexal mass or tenderness  Musculoskeletal: Normal range of motion. She exhibits no edema.  Lymphadenopathy:    She has no cervical adenopathy.  Neurological: She is alert and oriented to person, place, and time. She has normal reflexes.  Skin: Skin is warm and dry.  Psychiatric: She has a normal mood and affect. Her behavior is normal. Judgment and thought content normal.  Nursing note and vitals reviewed.  BP 132/84   Pulse 81   Temp (!) 97 F (36.1 C) (Oral)   Ht 5' 5" (1.651 m)   Wt 195 lb (88.5 kg)   BMI 32.45 kg/m         Assessment & Plan:  Meredith Ruiz comes in today with chief complaint of Annual Exam   Diagnosis and orders addressed:  1. Essential hypertension Low sodium diet - CMP14+EGFR - Lipid panel  2.  Acquired hypothyroidism - CBC with Differential/Platelet - Thyroid Panel With TSH  3. BMI 32.0-32.9,adult Discussed diet and exercise for person with BMI >25 Will recheck weight in 3-6 months  4. Gynecologic exam normal - IGP, Aptima HPV, rfx 16/18,45 - Pap IG (Image Guided)   Labs pending Health Maintenance reviewed Diet and exercise encouraged  Follow up plan: 6 months   Mary-Margaret Hassell Done, FNP

## 2018-01-12 LAB — CBC WITH DIFFERENTIAL/PLATELET
BASOS ABS: 0 10*3/uL (ref 0.0–0.2)
Basos: 0 %
EOS (ABSOLUTE): 0.1 10*3/uL (ref 0.0–0.4)
Eos: 1 %
HEMOGLOBIN: 13.9 g/dL (ref 11.1–15.9)
Hematocrit: 43.7 % (ref 34.0–46.6)
IMMATURE GRANS (ABS): 0 10*3/uL (ref 0.0–0.1)
IMMATURE GRANULOCYTES: 0 %
LYMPHS: 24 %
Lymphocytes Absolute: 1.8 10*3/uL (ref 0.7–3.1)
MCH: 27.6 pg (ref 26.6–33.0)
MCHC: 31.8 g/dL (ref 31.5–35.7)
MCV: 87 fL (ref 79–97)
MONOCYTES: 6 %
Monocytes Absolute: 0.5 10*3/uL (ref 0.1–0.9)
NEUTROS PCT: 69 %
Neutrophils Absolute: 5.1 10*3/uL (ref 1.4–7.0)
Platelets: 270 10*3/uL (ref 150–450)
RBC: 5.04 x10E6/uL (ref 3.77–5.28)
RDW: 14.9 % (ref 12.3–15.4)
WBC: 7.5 10*3/uL (ref 3.4–10.8)

## 2018-01-12 LAB — CMP14+EGFR
ALBUMIN: 4.3 g/dL (ref 3.5–4.8)
ALK PHOS: 156 IU/L — AB (ref 39–117)
ALT: 11 IU/L (ref 0–32)
AST: 15 IU/L (ref 0–40)
Albumin/Globulin Ratio: 1.4 (ref 1.2–2.2)
BUN / CREAT RATIO: 16 (ref 12–28)
BUN: 17 mg/dL (ref 8–27)
Bilirubin Total: 0.5 mg/dL (ref 0.0–1.2)
CALCIUM: 9.5 mg/dL (ref 8.7–10.3)
CO2: 25 mmol/L (ref 20–29)
CREATININE: 1.04 mg/dL — AB (ref 0.57–1.00)
Chloride: 104 mmol/L (ref 96–106)
GFR calc Af Amer: 62 mL/min/{1.73_m2} (ref >59)
GFR calc non Af Amer: 54 mL/min/{1.73_m2} — ABNORMAL LOW (ref >59)
GLUCOSE: 113 mg/dL — AB (ref 65–99)
Globulin, Total: 3.1 g/dL (ref 1.5–4.5)
Potassium: 3.9 mmol/L (ref 3.5–5.2)
Sodium: 144 mmol/L (ref 134–144)
Total Protein: 7.4 g/dL (ref 6.0–8.5)

## 2018-01-12 LAB — LIPID PANEL
CHOLESTEROL TOTAL: 192 mg/dL (ref 100–199)
Chol/HDL Ratio: 4.1 ratio (ref 0.0–4.4)
HDL: 47 mg/dL (ref >39)
LDL CALC: 129 mg/dL — AB (ref 0–99)
TRIGLYCERIDES: 81 mg/dL (ref 0–149)
VLDL Cholesterol Cal: 16 mg/dL (ref 5–40)

## 2018-01-12 LAB — THYROID PANEL WITH TSH
Free Thyroxine Index: 1.6 (ref 1.2–4.9)
T3 Uptake Ratio: 24 % (ref 24–39)
T4, Total: 6.5 ug/dL (ref 4.5–12.0)
TSH: 3.77 u[IU]/mL (ref 0.450–4.500)

## 2018-01-14 LAB — PAP IG (IMAGE GUIDED): PAP SMEAR COMMENT: 0

## 2018-02-21 ENCOUNTER — Ambulatory Visit: Payer: Medicare Other | Admitting: Nurse Practitioner

## 2018-05-09 ENCOUNTER — Other Ambulatory Visit: Payer: Self-pay | Admitting: Nurse Practitioner

## 2018-05-09 DIAGNOSIS — I10 Essential (primary) hypertension: Secondary | ICD-10-CM

## 2018-05-10 NOTE — Telephone Encounter (Signed)
OV 07/12/18 

## 2018-05-12 ENCOUNTER — Other Ambulatory Visit: Payer: Self-pay | Admitting: Nurse Practitioner

## 2018-05-12 DIAGNOSIS — E039 Hypothyroidism, unspecified: Secondary | ICD-10-CM

## 2018-05-16 DIAGNOSIS — Z1231 Encounter for screening mammogram for malignant neoplasm of breast: Secondary | ICD-10-CM | POA: Diagnosis not present

## 2018-05-16 LAB — HM MAMMOGRAPHY

## 2018-07-12 ENCOUNTER — Encounter: Payer: Self-pay | Admitting: Nurse Practitioner

## 2018-07-12 ENCOUNTER — Ambulatory Visit (INDEPENDENT_AMBULATORY_CARE_PROVIDER_SITE_OTHER): Payer: Medicare Other | Admitting: Nurse Practitioner

## 2018-07-12 VITALS — BP 126/76 | HR 78 | Temp 97.1°F | Ht 65.0 in | Wt 195.0 lb

## 2018-07-12 DIAGNOSIS — I10 Essential (primary) hypertension: Secondary | ICD-10-CM | POA: Diagnosis not present

## 2018-07-12 DIAGNOSIS — Z6832 Body mass index (BMI) 32.0-32.9, adult: Secondary | ICD-10-CM

## 2018-07-12 DIAGNOSIS — E039 Hypothyroidism, unspecified: Secondary | ICD-10-CM | POA: Diagnosis not present

## 2018-07-12 MED ORDER — LEVOTHYROXINE SODIUM 25 MCG PO TABS: ORAL_TABLET | ORAL | 1 refills | Status: DC

## 2018-07-12 MED ORDER — LISINOPRIL 20 MG PO TABS: 20.0000 mg | ORAL_TABLET | Freq: Every day | ORAL | 1 refills | Status: DC

## 2018-07-12 NOTE — Progress Notes (Signed)
Subjective:    Patient ID: Meredith Ruiz, female    DOB: [birth year], 72 y.o.   MRN: [id number]   Chief Complaint: medical management of chronic issues  HPI:  1. Essential hypertension  No c/o chest pain, sob or headache. Does not check blood pressure at home. BP Readings from Last 3 Encounters:  01/11/18 132/84  11/18/17 133/77  10/30/16 139/77     2. Acquired hypothyroidism  No problems that she is aware of.  3. BMI 32.0-32.9,adult  No recent weight changes    Outpatient Encounter Medications as of 07/12/2018  Medication Sig  . aspirin 81 MG tablet Take 81 mg by mouth daily.  Marland Kitchen levothyroxine (SYNTHROID, LEVOTHROID) 25 MCG tablet TAKE 1 TABLET BY MOUTH ONCE DAILY BEFORE BREAKFAST  . lisinopril (PRINIVIL,ZESTRIL) 20 MG tablet TAKE 1 TABLET BY MOUTH ONCE DAILY    * she did cologuard and ups lost it she said- now she is refusing to do.   New complaints: None today  Social history: Has retired from [employer] and her 2 children still live with her.   Review of Systems  Constitutional: Negative for activity change and appetite change.  HENT: Negative.   Eyes: Negative for pain.  Respiratory: Negative for shortness of breath.   Cardiovascular: Negative for chest pain, palpitations and leg swelling.  Gastrointestinal: Negative for abdominal pain.  Endocrine: Negative for polydipsia.  Genitourinary: Negative.   Skin: Negative for rash.  Neurological: Negative for dizziness, weakness and headaches.  Hematological: Does not bruise/bleed easily.  Psychiatric/Behavioral: Negative.   All other systems reviewed and are negative.      Objective:   Physical Exam  Constitutional: She is oriented to person, place, and time. She appears well-developed and well-nourished. No distress.  HENT:  Head: Normocephalic.  Nose: Nose normal.  Mouth/Throat: Oropharynx is clear and moist.  Eyes: Pupils are equal, round, and reactive to light. EOM are normal.  Neck: Normal  range of motion. Neck supple. No JVD present. Carotid bruit is not present.  Cardiovascular: Normal rate, regular rhythm, normal heart sounds and intact distal pulses.  Pulmonary/Chest: Effort normal and breath sounds normal. No respiratory distress. She has no wheezes. She has no rales. She exhibits no tenderness.  Abdominal: Soft. Normal appearance, normal aorta and bowel sounds are normal. She exhibits no distension, no abdominal bruit, no pulsatile midline mass and no mass. There is no splenomegaly or hepatomegaly. There is no tenderness.  Musculoskeletal: Normal range of motion. She exhibits no edema.  Lymphadenopathy:    She has no cervical adenopathy.  Neurological: She is alert and oriented to person, place, and time. She has normal reflexes.  Skin: Skin is warm and dry.  Psychiatric: She has a normal mood and affect. Her behavior is normal. Judgment and thought content normal.  Nursing note and vitals reviewed.   BP 126/76   Pulse 78   Temp (!) 97.1 F (36.2 C) (Oral)   Ht '5\' 5"'  (1.651 m)   Wt 195 lb (88.5 kg)   BMI 32.45 kg/m      Assessment & Plan:  Meredith Ruiz comes in today with chief complaint of Medical Management of Chronic Issues   Diagnosis and orders addressed:  1. Essential hypertension Low sodium diet - CMP14+EGFR - Lipid panel - lisinopril (PRINIVIL,ZESTRIL) 20 MG tablet; Take 1 tablet (20 mg total) by mouth daily.  Dispense: 90 tablet; Refill: 1  2. Acquired hypothyroidism - Thyroid Panel With TSH - levothyroxine (SYNTHROID, LEVOTHROID) 25 MCG  tablet; TAKE 1 TABLET BY MOUTH ONCE DAILY BEFORE BREAKFAST  Dispense: 90 tablet; Refill: 1  3. BMI 32.0-32.9,adult Discussed diet and exercise for person with BMI >25 Will recheck weight in 3-6 months   Labs pending Health Maintenance reviewed Diet and exercise encouraged  Follow up plan: 6 months   Meredith Hassell Done, FNP

## 2018-07-12 NOTE — Patient Instructions (Signed)
Skin Yeast Infection Skin yeast infection is a condition in which there is an overgrowth of yeast (candida) that normally lives on the skin. This condition usually occurs in areas of the skin that are constantly warm and moist, such as the armpits or the groin. What are the causes? This condition is caused by a change in the normal balance of the yeast and bacteria that live on the skin. What increases the risk? This condition is more likely to develop in:  People who are obese.  Pregnant women.  Women who take birth control pills.  People who have diabetes.  People who take antibiotic medicines.  People who take steroid medicines.  People who are malnourished.  People who have a weak defense (immune) system.  People who are 65 years of age or older.  What are the signs or symptoms? Symptoms of this condition include:  A red, swollen area of the skin.  Bumps on the skin.  Itchiness.  How is this diagnosed? This condition is diagnosed with a medical history and physical exam. Your health care provider may check for yeast by taking light scrapings of the skin to be viewed under a microscope. How is this treated? This condition is treated with medicine. Medicines may be prescribed or be available over-the-counter. The medicines may be:  Taken by mouth (orally).  Applied as a cream.  Follow these instructions at home:  Take or apply over-the-counter and prescription medicines only as told by your health care provider.  Eat more yogurt. This may help to keep your yeast infection from returning.  Maintain a healthy weight. If you need help losing weight, talk with your health care provider.  Keep your skin clean and dry.  If you have diabetes, keep your blood sugar under control. Contact a health care provider if:  Your symptoms go away and then return.  Your symptoms do not get better with treatment.  Your symptoms get worse.  Your rash spreads.  You have a  fever or chills.  You have new symptoms.  You have new warmth or redness of your skin. This information is not intended to replace advice given to you by your health care provider. Make sure you discuss any questions you have with your health care provider. Document Released: 04/21/2011 Document Revised: 03/29/2016 Document Reviewed: 02/04/2015 Elsevier Interactive Patient Education  2018 Elsevier Inc.  

## 2018-07-13 LAB — SPECIMEN STATUS

## 2018-07-13 LAB — CMP14+EGFR
A/G RATIO: 1.5 (ref 1.2–2.2)
ALT: 11 IU/L (ref 0–32)
AST: 14 IU/L (ref 0–40)
Albumin: 4.1 g/dL (ref 3.5–4.8)
Alkaline Phosphatase: 135 IU/L — ABNORMAL HIGH (ref 39–117)
BILIRUBIN TOTAL: 0.4 mg/dL (ref 0.0–1.2)
BUN/Creatinine Ratio: 15 (ref 12–28)
BUN: 15 mg/dL (ref 8–27)
CALCIUM: 9.7 mg/dL (ref 8.7–10.3)
CHLORIDE: 105 mmol/L (ref 96–106)
CO2: 23 mmol/L (ref 20–29)
Creatinine, Ser: 0.97 mg/dL (ref 0.57–1.00)
GFR calc Af Amer: 68 mL/min/{1.73_m2} (ref >59)
GFR, EST NON AFRICAN AMERICAN: 59 mL/min/{1.73_m2} — AB (ref >59)
Globulin, Total: 2.7 g/dL (ref 1.5–4.5)
Glucose: 109 mg/dL — ABNORMAL HIGH (ref 65–99)
POTASSIUM: 4.1 mmol/L (ref 3.5–5.2)
SODIUM: 142 mmol/L (ref 134–144)
Total Protein: 6.8 g/dL (ref 6.0–8.5)

## 2018-07-13 LAB — THYROID PANEL WITH TSH
Free Thyroxine Index: 1.7 (ref 1.2–4.9)
T3 UPTAKE RATIO: 25 % (ref 24–39)
T4, Total: 6.7 ug/dL (ref 4.5–12.0)
TSH: 4.91 u[IU]/mL — ABNORMAL HIGH (ref 0.450–4.500)

## 2018-07-13 LAB — LIPID PANEL
CHOL/HDL RATIO: 3.9 ratio (ref 0.0–4.4)
Cholesterol, Total: 187 mg/dL (ref 100–199)
HDL: 48 mg/dL (ref >39)
LDL Calculated: 123 mg/dL — ABNORMAL HIGH (ref 0–99)
TRIGLYCERIDES: 79 mg/dL (ref 0–149)
VLDL Cholesterol Cal: 16 mg/dL (ref 5–40)

## 2018-07-27 ENCOUNTER — Telehealth: Payer: Self-pay | Admitting: *Deleted

## 2018-07-27 NOTE — Telephone Encounter (Signed)
Fax from [facility] Refill request for Fluticasone/ketoc cr 1:1 1 gm apply to affected areas up to twice a day prn Not on med list originally [place] [facility] [place] Please advise

## 2018-07-28 NOTE — Telephone Encounter (Signed)
This is not on rx list, who has rx this in past- have pharmacy send refill request

## 2018-07-28 NOTE — Telephone Encounter (Signed)
Please read Lynden Angathy note

## 2019-01-27 ENCOUNTER — Other Ambulatory Visit: Payer: Self-pay | Admitting: Nurse Practitioner

## 2019-01-27 DIAGNOSIS — I10 Essential (primary) hypertension: Secondary | ICD-10-CM

## 2019-01-30 ENCOUNTER — Other Ambulatory Visit: Payer: Self-pay | Admitting: Nurse Practitioner

## 2019-01-30 DIAGNOSIS — I10 Essential (primary) hypertension: Secondary | ICD-10-CM

## 2019-02-01 ENCOUNTER — Other Ambulatory Visit: Payer: Self-pay | Admitting: *Deleted

## 2019-02-01 ENCOUNTER — Telehealth: Payer: Self-pay | Admitting: Nurse Practitioner

## 2019-02-01 NOTE — Telephone Encounter (Signed)
Pt is wanting to talk to [facility] nurse about her medicines said that she thinks that on of hers has been changed

## 2019-02-01 NOTE — Telephone Encounter (Signed)
LM [id number]

## 2019-02-17 NOTE — Telephone Encounter (Signed)
Multiple attempts made to contact patient.  This encounter will now be closed  

## 2019-03-05 ENCOUNTER — Other Ambulatory Visit: Payer: Self-pay | Admitting: Nurse Practitioner

## 2019-03-05 DIAGNOSIS — I10 Essential (primary) hypertension: Secondary | ICD-10-CM

## 2019-03-28 ENCOUNTER — Other Ambulatory Visit: Payer: Self-pay

## 2019-03-28 ENCOUNTER — Encounter: Payer: Self-pay | Admitting: Nurse Practitioner

## 2019-03-28 ENCOUNTER — Ambulatory Visit (INDEPENDENT_AMBULATORY_CARE_PROVIDER_SITE_OTHER): Payer: Medicare Other | Admitting: Nurse Practitioner

## 2019-03-28 VITALS — BP 142/78 | HR 64 | Temp 97.3°F | Ht 65.0 in | Wt 191.0 lb

## 2019-03-28 DIAGNOSIS — I10 Essential (primary) hypertension: Secondary | ICD-10-CM

## 2019-03-28 DIAGNOSIS — E039 Hypothyroidism, unspecified: Secondary | ICD-10-CM

## 2019-03-28 DIAGNOSIS — Z6832 Body mass index (BMI) 32.0-32.9, adult: Secondary | ICD-10-CM | POA: Diagnosis not present

## 2019-03-28 MED ORDER — LISINOPRIL 20 MG PO TABS: ORAL_TABLET | ORAL | 1 refills | Status: DC

## 2019-03-28 MED ORDER — LEVOTHYROXINE SODIUM 25 MCG PO TABS: ORAL_TABLET | ORAL | 1 refills | Status: DC

## 2019-03-28 NOTE — Progress Notes (Signed)
Subjective:    Patient ID: Meredith Ruiz, female    DOB: 11/18/1945, 73 y.o.   MRN: [id number]   Chief Complaint: Medical Management of Chronic Issues    HPI:  1. Essential hypertension No c/o chest pain, sob or headache. Does not check blood pressure at home. BP Readings from Last 3 Encounters:  03/28/19 (!) 151/85  07/12/18 126/76  01/11/18 132/84   * has not taken any of her meds today  2. Acquired hypothyroidism No problems that she is aware of. Her  Last TSH was 4.9. no changes were made to meds.  3. BMI 32.0-32.9,adult No recent weight changes    Outpatient Encounter Medications as of 03/28/2019  Medication Sig  . aspirin 81 MG tablet Take 81 mg by mouth daily.  Marland Kitchen levothyroxine (SYNTHROID, LEVOTHROID) 25 MCG tablet TAKE 1 TABLET BY MOUTH ONCE DAILY BEFORE BREAKFAST  . lisinopril (ZESTRIL) 20 MG tablet TAKE 1 TABLET BY MOUTH ONCE DAILY (NEEDS TO BE SEEN BEFORE NEXT REFILL)     Past Surgical History:  Procedure Laterality Date  . CESAREAN SECTION      Family History  Problem Relation Age of Onset  . Diabetes Mother   . Heart attack Father     New complaints: None today  Social history: Her children live with her . She is retired from [employer]  Controlled substance contract: N/A    Review of Systems  Constitutional: Negative for activity change and appetite change.  HENT: Negative.   Eyes: Negative for pain.  Respiratory: Negative for shortness of breath.   Cardiovascular: Negative for chest pain, palpitations and leg swelling.  Gastrointestinal: Negative for abdominal pain.  Endocrine: Negative for polydipsia.  Genitourinary: Negative.   Skin: Negative for rash.  Neurological: Negative for dizziness, weakness and headaches.  Hematological: Does not bruise/bleed easily.  Psychiatric/Behavioral: Negative.   All other systems reviewed and are negative.      Objective:   Physical Exam Vitals signs and nursing note reviewed.   Constitutional:      General: She is not in acute distress.    Appearance: Normal appearance. She is well-developed.  HENT:     Head: Normocephalic.     Nose: Nose normal.  Eyes:     Pupils: Pupils are equal, round, and reactive to light.  Neck:     Musculoskeletal: Normal range of motion and neck supple.     Vascular: No carotid bruit or JVD.  Cardiovascular:     Rate and Rhythm: Normal rate and regular rhythm.     Heart sounds: Normal heart sounds.  Pulmonary:     Effort: Pulmonary effort is normal. No respiratory distress.     Breath sounds: Normal breath sounds. No wheezing or rales.  Chest:     Chest wall: No tenderness.  Abdominal:     General: Bowel sounds are normal. There is no distension or abdominal bruit.     Palpations: Abdomen is soft. There is no hepatomegaly, splenomegaly, mass or pulsatile mass.     Tenderness: There is no abdominal tenderness.  Musculoskeletal: Normal range of motion.  Lymphadenopathy:     Cervical: No cervical adenopathy.  Skin:    General: Skin is warm and dry.  Neurological:     Mental Status: She is alert and oriented to person, place, and time.     Deep Tendon Reflexes: Reflexes are normal and symmetric.  Psychiatric:        Behavior: Behavior normal.  Thought Content: Thought content normal.        Judgment: Judgment normal.    BP (!) 142/78 (BP Location: Left Arm, Cuff Size: Normal)   Pulse 64   Temp (!) 97.3 F (36.3 C) (Oral)   Ht '5\' 5"'  (1.651 m)   Wt 191 lb (86.6 kg)   BMI 31.78 kg/m          Assessment & Plan:  Meredith Ruiz comes in today with chief complaint of Medical Management of Chronic Issues   Diagnosis and orders addressed:  1. Essential hypertension Low sodium diet - lisinopril (ZESTRIL) 20 MG tablet; TAKE 1 TABLET BY MOUTH ONCE DAILY (NEEDS TO BE SEEN BEFORE NEXT REFILL)  Dispense: 90 tablet; Refill: 1 - CMP14+EGFR - Lipid panel  2. Acquired hypothyroidism - levothyroxine (SYNTHROID) 25  MCG tablet; TAKE 1 TABLET BY MOUTH ONCE DAILY BEFORE BREAKFAST  Dispense: 90 tablet; Refill: 1 - Thyroid Panel With TSH  3. BMI 32.0-32.9,adult Discussed diet and exercise for person with BMI >25 Will recheck weight in 3-6 months   Labs pending Health Maintenance reviewed Diet and exercise encouraged  Follow up plan: 6 months   Meredith Hassell Done, FNP

## 2019-03-29 LAB — CMP14+EGFR
ALT: 11 IU/L (ref 0–32)
AST: 12 IU/L (ref 0–40)
Albumin/Globulin Ratio: 1.7 (ref 1.2–2.2)
Albumin: 4.3 g/dL (ref 3.7–4.7)
Alkaline Phosphatase: 123 IU/L — ABNORMAL HIGH (ref 39–117)
BUN/Creatinine Ratio: 17 (ref 12–28)
BUN: 17 mg/dL (ref 8–27)
Bilirubin Total: 0.4 mg/dL (ref 0.0–1.2)
CO2: 24 mmol/L (ref 20–29)
Calcium: 9.5 mg/dL (ref 8.7–10.3)
Chloride: 104 mmol/L (ref 96–106)
Creatinine, Ser: 0.98 mg/dL (ref 0.57–1.00)
GFR calc Af Amer: 67 mL/min/{1.73_m2} (ref >59)
GFR calc non Af Amer: 58 mL/min/{1.73_m2} — ABNORMAL LOW (ref >59)
Globulin, Total: 2.5 g/dL (ref 1.5–4.5)
Glucose: 99 mg/dL (ref 65–99)
Potassium: 4.1 mmol/L (ref 3.5–5.2)
Sodium: 142 mmol/L (ref 134–144)
Total Protein: 6.8 g/dL (ref 6.0–8.5)

## 2019-03-29 LAB — LIPID PANEL
Chol/HDL Ratio: 3.6 ratio (ref 0.0–4.4)
Cholesterol, Total: 176 mg/dL (ref 100–199)
HDL: 49 mg/dL (ref >39)
LDL Calculated: 113 mg/dL — ABNORMAL HIGH (ref 0–99)
Triglycerides: 72 mg/dL (ref 0–149)
VLDL Cholesterol Cal: 14 mg/dL (ref 5–40)

## 2019-03-29 LAB — THYROID PANEL WITH TSH
Free Thyroxine Index: 2 (ref 1.2–4.9)
T3 Uptake Ratio: 28 % (ref 24–39)
T4, Total: 7.3 ug/dL (ref 4.5–12.0)
TSH: 3.77 u[IU]/mL (ref 0.450–4.500)

## 2019-03-31 ENCOUNTER — Telehealth: Payer: Self-pay | Admitting: Nurse Practitioner

## 2019-04-06 NOTE — Telephone Encounter (Signed)
Multiple attempts made to contact patient.  This encounter will now be closed  

## 2019-05-03 ENCOUNTER — Ambulatory Visit (INDEPENDENT_AMBULATORY_CARE_PROVIDER_SITE_OTHER): Payer: Medicare Other | Admitting: Family Medicine

## 2019-05-03 ENCOUNTER — Other Ambulatory Visit: Payer: Self-pay

## 2019-05-03 ENCOUNTER — Encounter: Payer: Self-pay | Admitting: Family Medicine

## 2019-05-03 ENCOUNTER — Other Ambulatory Visit: Payer: Self-pay | Admitting: Family Medicine

## 2019-05-03 VITALS — BP 126/77 | HR 89 | Temp 95.1°F | Resp 20 | Ht 65.0 in | Wt 188.2 lb

## 2019-05-03 DIAGNOSIS — M25561 Pain in right knee: Secondary | ICD-10-CM

## 2019-05-03 DIAGNOSIS — L304 Erythema intertrigo: Secondary | ICD-10-CM | POA: Diagnosis not present

## 2019-05-03 MED ORDER — KETOCONAZOLE 2 % EX CREA: 1.0000 "application " | TOPICAL_CREAM | Freq: Every day | CUTANEOUS | 1 refills | Status: DC

## 2019-05-03 MED ORDER — FLUOCINOLONE ACETONIDE 0.01 % EX CREA: TOPICAL_CREAM | Freq: Two times a day (BID) | CUTANEOUS | 0 refills | Status: DC

## 2019-05-03 MED ORDER — DICLOFENAC SODIUM 1 % TD GEL: 4.0000 g | Freq: Four times a day (QID) | TRANSDERMAL | 1 refills | Status: AC

## 2019-05-03 NOTE — Patient Instructions (Signed)
Meniscus Tear  A meniscus tear is a knee injury that happens when a piece of the meniscus is torn. The meniscus is a thick, rubbery, wedge-shaped cartilage in the knee. Two menisci are located in each knee. They sit between the upper bone (femur) and lower bone (tibia) that make up the knee joint. Each meniscus acts as a shock absorber for the knee. A torn meniscus is one of the most common types of knee injuries. This injury can range from mild to severe. Surgery may be needed to repair a severe tear. What are the causes? This condition may be caused by any kneeling, squatting, twisting, or pivoting movement. Sports-related injuries are the most common cause. These often occur from:  Running and stopping suddenly. ? Changing direction. ? Being tackled or knocked off your feet.  Lifting or carrying heavy weights. As people get older, their menisci get thinner and weaker. In these people, tears can happen more easily, such as from climbing stairs. What increases the risk? You are more likely to develop this condition if you:  Play contact sports.  Have a job that requires kneeling or squatting.  Are female.  Are over 40 years old. What are the signs or symptoms? Symptoms of this condition include:  Knee pain, especially at the side of the knee joint. You may feel pain when the injury occurs, or you may only hear a pop and feel pain later.  A feeling that your knee is clicking, catching, locking, or giving way.  Not being able to fully bend or extend your knee.  Bruising or swelling in your knee. How is this diagnosed? This condition may be diagnosed based on your symptoms and a physical exam. You may also have tests, such as:  X-rays.  MRI.  A procedure to look inside your knee with a narrow surgical telescope (arthroscopy). You may be referred to a knee specialist (orthopedic surgeon). How is this treated? Treatment for this injury depends on the severity of the tear.  Treatment for a mild tear may include:  Rest.  Medicine to reduce pain and swelling. This is usually a nonsteroidal anti-inflammatory drug (NSAID), like ibuprofen.  A knee brace, sleeve, or wrap.  Using crutches or a walker to keep weight off your knee and to help you walk.  Exercises to strengthen your knee (physical therapy). You may need surgery if you have a severe tear or if other treatments are not working. Follow these instructions at home: If you have a brace, sleeve, or wrap:  Wear it as told by your health care provider. Remove it only as told by your health care provider.  Loosen the brace, sleeve, or wrap if your toes tingle, become numb, or turn cold and blue.  Keep the brace, sleeve, or wrap clean and dry.  If the brace, sleeve, or wrap is not waterproof: ? Do not let it get wet. ? Cover it with a watertight covering when you take a bath or shower. Managing pain and swelling   Take over-the-counter and prescription medicines only as told by your health care provider.  If directed, put ice on your knee: ? If you have a removable brace, sleeve, or wrap, remove it as told by your health care provider. ? Put ice in a plastic bag. ? Place a towel between your skin and the bag. ? Leave the ice on for 20 minutes, 2-3 times per day.  Move your toes often to avoid stiffness and to lessen swelling.  Raise (  elevate) the injured area above the level of your heart while you are sitting or lying down. Activity  Do not use the injured limb to support your body weight until your health care provider says that you can. Use crutches or a walker as told by your health care provider.  Return to your normal activities as told by your health care provider. Ask your health care provider what activities are safe for you.  Perform range-of-motion exercises only as told by your health care provider.  Begin doing exercises to strengthen your knee and leg muscles only as told by your  health care provider. After you recover, your health care provider may recommend these exercises to help prevent another injury. General instructions  Use a knee brace, sleeve, or wrap as told by your health care provider.  Ask your health care provider when it is safe to drive if you have a brace, sleeve, or wrap on your knee.  Do not use any products that contain nicotine or tobacco, such as cigarettes, e-cigarettes, and chewing tobacco. If you need help quitting, ask your health care provider.  Ask your health care provider if the medicine prescribed to you: ? Requires you to avoid driving or using heavy machinery. ? Can cause constipation. You may need to take these actions to prevent or treat constipation:  Drink enough fluid to keep your urine pale yellow.  Take over-the-counter or prescription medicines.  Eat foods that are high in fiber, such as beans, whole grains, and fresh fruits and vegetables.  Limit foods that are high in fat and processed sugars, such as fried or sweet foods.  Keep all follow-up visits as told by your health care provider. This is important. Contact a health care provider if:  You have a fever.  Your knee becomes red, tender, or swollen.  Your pain medicine is not helping.  Your symptoms get worse or do not improve after 2 weeks of home care. Summary  A meniscus tear is a knee injury that happens when a piece of the meniscus is torn.  Treatment for this injury depends on the severity of the tear. You may need surgery if you have a severe tear or if other treatments are not working.  Rest, ice, and raise (elevate) your injured knee as told by your health care provider. This will help lessen pain and swelling.  Contact a health care provider if you have new symptoms, or your symptoms get worse or do not improve after 2 weeks of home care.  Keep all follow-up visits as told by your health care provider. This is important. This information is not  intended to replace advice given to you by your health care provider. Make sure you discuss any questions you have with your health care provider. Document Released: 10/24/2002 Document Revised: 02/15/2018 Document Reviewed: 02/15/2018 Elsevier Patient Education  2020 Elsevier Inc.  

## 2019-05-03 NOTE — Progress Notes (Signed)
Subjective: CC: Knee pain PCP: Chevis Pretty, FNP VVO:[id number] Meredith Ruiz is a 73 y.o. female presenting to clinic today for:  1.  Knee pain Patient with couple month history of right-sided knee pain.  She notes that she stepped off her car strangely at [employer] a couple of months ago and her knee seem to give out.  She has had stable but unimproving knee pain medially since that time.  She has mild swelling.  No erythema or increased warmth.  She has not been able to walk without a limp for several weeks now.  She has been using over-the-counter cream with some improvement as well as ice with again some improvement.  No use of oral NSAIDs.   ROS: Per HPI  Allergies  Allergen Reactions  . Sulfa Antibiotics    Past Medical History:  Diagnosis Date  . Hypertension   . Thyroid disease     Current Outpatient Medications:  .  aspirin 81 MG tablet, Take 81 mg by mouth daily., Disp: , Rfl:  .  levothyroxine (SYNTHROID) 25 MCG tablet, TAKE 1 TABLET BY MOUTH ONCE DAILY BEFORE BREAKFAST, Disp: 90 tablet, Rfl: 1 .  lisinopril (ZESTRIL) 20 MG tablet, TAKE 1 TABLET BY MOUTH ONCE DAILY (NEEDS TO BE SEEN BEFORE NEXT REFILL), Disp: 90 tablet, Rfl: 1 Social History   Socioeconomic History  . Marital status: Legally Separated    Spouse name: Not on file  . Number of children: Not on file  . Years of education: Not on file  . Highest education level: Not on file  Occupational History  . Not on file  Social Needs  . Financial resource strain: Not on file  . Food insecurity    Worry: Not on file    Inability: Not on file  . Transportation needs    Medical: Not on file    Non-medical: Not on file  Tobacco Use  . Smoking status: Former Smoker    Quit date: 11/21/1973    Years since quitting: 45.4  . Smokeless tobacco: Never Used  Substance and Sexual Activity  . Alcohol use: No  . Drug use: No  . Sexual activity: Not on file  Lifestyle  . Physical activity    Days per  week: Not on file    Minutes per session: Not on file  . Stress: Not on file  Relationships  . Social Herbalist on phone: Not on file    Gets together: Not on file    Attends religious service: Not on file    Active member of club or organization: Not on file    Attends meetings of clubs or organizations: Not on file    Relationship status: Not on file  . Intimate partner violence    Fear of current or ex partner: Not on file    Emotionally abused: Not on file    Physically abused: Not on file    Forced sexual activity: Not on file  Other Topics Concern  . Not on file  Social History Narrative  . Not on file   Family History  Problem Relation Age of Onset  . Diabetes Mother   . Heart attack Father     Objective: Office vital signs reviewed. BP 126/77   Pulse 89   Temp (!) 95.1 F (35.1 C) (Temporal)   Resp 20   Ht 5\' 5"  (1.651 m)   Wt 188 lb 3.2 oz (85.4 kg)   SpO2 95%  BMI 31.32 kg/m   Physical Examination:  General: Awake, alert, obese, No acute distress MSK: Antalgic gait and station  Right knee: Right knee with full active range of motion but pain with flexion and extension.  She has tenderness to palpation along the medial joint line.  No palpable deformities.  No tenderness palpation to patella, patellar tendon or quad tendon.  No ligamentous laxity.  She has pain with Thessaly's. Skin: Mild splotchy erythema noted along the right upper abdomen.  No significant fungal findings noted along the inferior breast.  Assessment/ Plan: 73 y.o. female   1. Medial knee pain, right I have a high suspicion that she is having a meniscal injury/possible tear.  I have recommended NSAIDs and physical therapy.  We will proceed with topical NSAID per her request and a handout for physical therapy also provided.  She has a couple family members that her physical therapist and can work with her at home so does not want to have a formal referral today.  We discussed  consideration for referral to orthopedics for further evaluation.  She would like to hold off on this for now.  If no significant improvement with above therapies plan for referral.  She will contact me if needed - diclofenac sodium (VOLTAREN) 1 % GEL; Apply 4 g topically 4 (four) times daily. (knee pain). Ok to give OTC version if not covered by insurance  Dispense: 200 g; Refill: 1  2. Intertrigo Not currently flaring but uses these medications PRN.  Refill sent - ketoconazole (NIZORAL) 2 % cream; Apply 1 application topically daily. For up to 2 weeks per flare  Dispense: 60 g; Refill: 1 - fluocinolone (VANOS) 0.01 % cream; Apply topically 2 (two) times daily.  Dispense: 30 g; Refill: 0   No orders of the defined types were placed in this encounter.  No orders of the defined types were placed in this encounter.    Raliegh IpAshly M Kylar Leonhardt, DO [facility] [phone]

## 2019-05-04 NOTE — Telephone Encounter (Signed)
Pharmacy comment:  NDC not covered by insurance.  betamethasone cream 0.05% cream is covered.

## 2019-05-09 ENCOUNTER — Telehealth: Payer: Self-pay | Admitting: Nurse Practitioner

## 2019-05-09 MED ORDER — FLUTICASONE PROPIONATE 0.05 % EX CREA: TOPICAL_CREAM | Freq: Two times a day (BID) | CUTANEOUS | 0 refills | Status: DC

## 2019-05-09 NOTE — Telephone Encounter (Signed)
cutivate cream rx sent to pharmacy

## 2019-09-28 ENCOUNTER — Encounter: Payer: Self-pay | Admitting: Nurse Practitioner

## 2019-09-28 ENCOUNTER — Other Ambulatory Visit: Payer: Self-pay

## 2019-09-28 ENCOUNTER — Ambulatory Visit (INDEPENDENT_AMBULATORY_CARE_PROVIDER_SITE_OTHER): Payer: Medicare Other | Admitting: Nurse Practitioner

## 2019-09-28 VITALS — BP 136/72 | HR 65 | Temp 98.0°F | Resp 20 | Ht 65.0 in | Wt 186.0 lb

## 2019-09-28 DIAGNOSIS — I10 Essential (primary) hypertension: Secondary | ICD-10-CM

## 2019-09-28 DIAGNOSIS — Z6832 Body mass index (BMI) 32.0-32.9, adult: Secondary | ICD-10-CM

## 2019-09-28 DIAGNOSIS — E039 Hypothyroidism, unspecified: Secondary | ICD-10-CM

## 2019-09-28 MED ORDER — LEVOTHYROXINE SODIUM 25 MCG PO TABS: ORAL_TABLET | ORAL | 1 refills | Status: DC

## 2019-09-28 MED ORDER — LISINOPRIL 20 MG PO TABS: ORAL_TABLET | ORAL | 1 refills | Status: DC

## 2019-09-28 NOTE — Patient Instructions (Signed)
DASH Eating Plan DASH stands for "Dietary Approaches to Stop Hypertension." The DASH eating plan is a healthy eating plan that has been shown to reduce high blood pressure (hypertension). It may also reduce your risk for type 2 diabetes, heart disease, and stroke. The DASH eating plan may also help with weight loss. What are tips for following this plan?  General guidelines  Avoid eating more than 2,300 mg (milligrams) of salt (sodium) a day. If you have hypertension, you may need to reduce your sodium intake to 1,500 mg a day.  Limit alcohol intake to no more than 1 drink a day for nonpregnant women and 2 drinks a day for men. One drink equals 12 oz of beer, 5 oz of wine, or 1 oz of hard liquor.  Work with your health care provider to maintain a healthy body weight or to lose weight. Ask what an ideal weight is for you.  Get at least 30 minutes of exercise that causes your heart to beat faster (aerobic exercise) most days of the week. Activities may include walking, swimming, or biking.  Work with your health care provider or diet and nutrition specialist (dietitian) to adjust your eating plan to your individual calorie needs. Reading food labels   Check food labels for the amount of sodium per serving. Choose foods with less than 5 percent of the Daily Value of sodium. Generally, foods with less than 300 mg of sodium per serving fit into this eating plan.  To find whole grains, look for the word "whole" as the first word in the ingredient list. Shopping  Buy products labeled as "low-sodium" or "no salt added."  Buy fresh foods. Avoid canned foods and premade or frozen meals. Cooking  Avoid adding salt when cooking. Use salt-free seasonings or herbs instead of table salt or sea salt. Check with your health care provider or pharmacist before using salt substitutes.  Do not fry foods. Cook foods using healthy methods such as baking, boiling, grilling, and broiling instead.  Cook with  heart-healthy oils, such as olive, canola, soybean, or sunflower oil. Meal planning  Eat a balanced diet that includes: ? 5 or more servings of fruits and vegetables each day. At each meal, try to fill half of your plate with fruits and vegetables. ? Up to 6-8 servings of whole grains each day. ? Less than 6 oz of lean meat, poultry, or fish each day. A 3-oz serving of meat is about the same size as a deck of cards. One egg equals 1 oz. ? 2 servings of low-fat dairy each day. ? A serving of nuts, seeds, or beans 5 times each week. ? Heart-healthy fats. Healthy fats called Omega-3 fatty acids are found in foods such as flaxseeds and coldwater fish, like sardines, salmon, and mackerel.  Limit how much you eat of the following: ? Canned or prepackaged foods. ? Food that is high in trans fat, such as fried foods. ? Food that is high in saturated fat, such as fatty meat. ? Sweets, desserts, sugary drinks, and other foods with added sugar. ? Full-fat dairy products.  Do not salt foods before eating.  Try to eat at least 2 vegetarian meals each week.  Eat more home-cooked food and less restaurant, buffet, and fast food.  When eating at a restaurant, ask that your food be prepared with less salt or no salt, if possible. What foods are recommended? The items listed may not be a complete list. Talk with your dietitian about   what dietary choices are best for you. Grains Whole-grain or whole-wheat bread. Whole-grain or whole-wheat pasta. Brown rice. Oatmeal. Quinoa. Bulgur. Whole-grain and low-sodium cereals. Pita bread. Low-fat, low-sodium crackers. Whole-wheat flour tortillas. Vegetables Fresh or frozen vegetables (raw, steamed, roasted, or grilled). Low-sodium or reduced-sodium tomato and vegetable juice. Low-sodium or reduced-sodium tomato sauce and tomato paste. Low-sodium or reduced-sodium canned vegetables. Fruits All fresh, dried, or frozen fruit. Canned fruit in natural juice (without  added sugar). Meat and other protein foods Skinless chicken or turkey. Ground chicken or turkey. Pork with fat trimmed off. Fish and seafood. Egg whites. Dried beans, peas, or lentils. Unsalted nuts, nut butters, and seeds. Unsalted canned beans. Lean cuts of beef with fat trimmed off. Low-sodium, lean deli meat. Dairy Low-fat (1%) or fat-free (skim) milk. Fat-free, low-fat, or reduced-fat cheeses. Nonfat, low-sodium ricotta or cottage cheese. Low-fat or nonfat yogurt. Low-fat, low-sodium cheese. Fats and oils Soft margarine without trans fats. Vegetable oil. Low-fat, reduced-fat, or light mayonnaise and salad dressings (reduced-sodium). Canola, safflower, olive, soybean, and sunflower oils. Avocado. Seasoning and other foods Herbs. Spices. Seasoning mixes without salt. Unsalted popcorn and pretzels. Fat-free sweets. What foods are not recommended? The items listed may not be a complete list. Talk with your dietitian about what dietary choices are best for you. Grains Baked goods made with fat, such as croissants, muffins, or some breads. Dry pasta or rice meal packs. Vegetables Creamed or fried vegetables. Vegetables in a cheese sauce. Regular canned vegetables (not low-sodium or reduced-sodium). Regular canned tomato sauce and paste (not low-sodium or reduced-sodium). Regular tomato and vegetable juice (not low-sodium or reduced-sodium). Pickles. Olives. Fruits Canned fruit in a light or heavy syrup. Fried fruit. Fruit in cream or butter sauce. Meat and other protein foods Fatty cuts of meat. Ribs. Fried meat. Bacon. Sausage. Bologna and other processed lunch meats. Salami. Fatback. Hotdogs. Bratwurst. Salted nuts and seeds. Canned beans with added salt. Canned or smoked fish. Whole eggs or egg yolks. Chicken or turkey with skin. Dairy Whole or 2% milk, cream, and half-and-half. Whole or full-fat cream cheese. Whole-fat or sweetened yogurt. Full-fat cheese. Nondairy creamers. Whipped toppings.  Processed cheese and cheese spreads. Fats and oils Butter. Stick margarine. Lard. Shortening. Ghee. Bacon fat. Tropical oils, such as coconut, palm kernel, or palm oil. Seasoning and other foods Salted popcorn and pretzels. Onion salt, garlic salt, seasoned salt, table salt, and sea salt. Worcestershire sauce. Tartar sauce. Barbecue sauce. Teriyaki sauce. Soy sauce, including reduced-sodium. Steak sauce. Canned and packaged gravies. Fish sauce. Oyster sauce. Cocktail sauce. Horseradish that you find on the shelf. Ketchup. Mustard. Meat flavorings and tenderizers. Bouillon cubes. Hot sauce and Tabasco sauce. Premade or packaged marinades. Premade or packaged taco seasonings. Relishes. Regular salad dressings. Where to find more information:  National Heart, Lung, and Blood Institute: www.nhlbi.nih.gov  American Heart Association: www.heart.org Summary  The DASH eating plan is a healthy eating plan that has been shown to reduce high blood pressure (hypertension). It may also reduce your risk for type 2 diabetes, heart disease, and stroke.  With the DASH eating plan, you should limit salt (sodium) intake to 2,300 mg a day. If you have hypertension, you may need to reduce your sodium intake to 1,500 mg a day.  When on the DASH eating plan, aim to eat more fresh fruits and vegetables, whole grains, lean proteins, low-fat dairy, and heart-healthy fats.  Work with your health care provider or diet and nutrition specialist (dietitian) to adjust your eating plan to your   individual calorie needs. This information is not intended to replace advice given to you by your health care provider. Make sure you discuss any questions you have with your health care provider. Document Revised: 07/16/2017 Document Reviewed: 07/27/2016 Elsevier Patient Education  2020 Elsevier Inc.  

## 2019-09-28 NOTE — Progress Notes (Signed)
Subjective:    Patient ID: Meredith Ruiz, female    DOB: 18-Feb-1946, 74 y.o.   MRN: [id number]   Chief Complaint: Medical Management of Chronic Issues    HPI:  1. Essential hypertension No c/o chest pain, sob or headache. Does not check blood pressure at home. BP Readings from Last 3 Encounters:  05/03/19 126/77  03/28/19 (!) 142/78  07/12/18 126/76     2. Acquired hypothyroidism Having no problems that aware of. Lab Results  Component Value Date   TSH 3.770 03/28/2019     3. BMI 32.0-32.9,adult No recent weight changes Wt Readings from Last 3 Encounters:  05/03/19 188 lb 3.2 oz (85.4 kg)  03/28/19 191 lb (86.6 kg)  07/12/18 195 lb (88.5 kg)   BMI Readings from Last 3 Encounters:  05/03/19 31.32 kg/m  03/28/19 31.78 kg/m  07/12/18 32.45 kg/m       Outpatient Encounter Medications as of 09/28/2019  Medication Sig  . aspirin 81 MG tablet Take 81 mg by mouth daily.  . diclofenac sodium (VOLTAREN) 1 % GEL Apply 4 g topically 4 (four) times daily. (knee pain). Ok to give OTC version if not covered by insurance  . Fluocinonide 0.1 % CREA APPLY CREAM TOPICALLY TWICE DAILY  . fluticasone (CUTIVATE) 0.05 % cream Apply topically 2 (two) times daily.  Marland Kitchen ketoconazole (NIZORAL) 2 % cream Apply 1 application topically daily. For up to 2 weeks per flare  . levothyroxine (SYNTHROID) 25 MCG tablet TAKE 1 TABLET BY MOUTH ONCE DAILY BEFORE BREAKFAST  . lisinopril (ZESTRIL) 20 MG tablet TAKE 1 TABLET BY MOUTH ONCE DAILY (NEEDS TO BE SEEN BEFORE NEXT REFILL)     Past Surgical History:  Procedure Laterality Date  . CESAREAN SECTION      Family History  Problem Relation Age of Onset  . Diabetes Mother   . Heart attack Father     New complaints: None today  Social history: Her kids still live with her. She is retired from [employer] substance contract: n/a    Review of Systems  Constitutional: Negative for diaphoresis.  Eyes: Negative for  pain.  Respiratory: Negative for shortness of breath.   Cardiovascular: Negative for chest pain, palpitations and leg swelling.  Gastrointestinal: Negative for abdominal pain.  Endocrine: Negative for polydipsia.  Skin: Negative for rash.  Neurological: Negative for dizziness, weakness and headaches.  Hematological: Does not bruise/bleed easily.  All other systems reviewed and are negative.      Objective:   Physical Exam Vitals and nursing note reviewed.  Constitutional:      General: She is not in acute distress.    Appearance: Normal appearance. She is well-developed.  HENT:     Head: Normocephalic.     Nose: Nose normal.  Eyes:     Pupils: Pupils are equal, round, and reactive to light.  Neck:     Vascular: No carotid bruit or JVD.  Cardiovascular:     Rate and Rhythm: Normal rate and regular rhythm.     Heart sounds: Normal heart sounds.  Pulmonary:     Effort: Pulmonary effort is normal. No respiratory distress.     Breath sounds: Normal breath sounds. No wheezing or rales.  Chest:     Chest wall: No tenderness.  Abdominal:     General: Bowel sounds are normal. There is no distension or abdominal bruit.     Palpations: Abdomen is soft. There is no hepatomegaly, splenomegaly, mass or pulsatile mass.  Tenderness: There is no abdominal tenderness.  Musculoskeletal:        General: Normal range of motion.     Cervical back: Normal range of motion and neck supple.  Lymphadenopathy:     Cervical: No cervical adenopathy.  Skin:    General: Skin is warm and dry.  Neurological:     Mental Status: She is alert and oriented to person, place, and time.     Deep Tendon Reflexes: Reflexes are normal and symmetric.  Psychiatric:        Behavior: Behavior normal.        Thought Content: Thought content normal.        Judgment: Judgment normal.    BP 136/72   Pulse 65   Temp 98 F (36.7 C) (Temporal)   Resp 20   Ht 5' 5" (1.651 m)   Wt 186 lb (84.4 kg)   SpO2 97%    BMI 30.95 kg/m         Assessment & Plan:  Meredith Ruiz comes in today with chief complaint of Medical Management of Chronic Issues   Diagnosis and orders addressed:  1. Essential hypertension Low sodium diet - lisinopril (ZESTRIL) 20 MG tablet; TAKE 1 TABLET BY MOUTH ONCE DAILY (NEEDS TO BE SEEN BEFORE NEXT REFILL)  Dispense: 90 tablet; Refill: 1 - CMP14+EGFR - Lipid panel  2. Acquired hypothyroidism - levothyroxine (SYNTHROID) 25 MCG tablet; TAKE 1 TABLET BY MOUTH ONCE DAILY BEFORE BREAKFAST  Dispense: 90 tablet; Refill: 1 - Thyroid Panel With TSH - CBC with Differential/Platelet  3. BMI 32.0-32.9,adult Discussed diet and exercise for person with BMI >25 Will recheck weight in 3-6 months   Labs pending Health Maintenance reviewed Diet and exercise encouraged  Follow up plan: 6 months   Mary-Margaret Hassell Done, FNP

## 2019-09-29 LAB — CBC WITH DIFFERENTIAL/PLATELET
Basophils Absolute: 0.1 10*3/uL (ref 0.0–0.2)
Basos: 1 %
EOS (ABSOLUTE): 0.1 10*3/uL (ref 0.0–0.4)
Eos: 2 %
Hematocrit: 42.2 % (ref 34.0–46.6)
Hemoglobin: 13.8 g/dL (ref 11.1–15.9)
Immature Grans (Abs): 0 10*3/uL (ref 0.0–0.1)
Immature Granulocytes: 0 %
Lymphocytes Absolute: 1.9 10*3/uL (ref 0.7–3.1)
Lymphs: 27 %
MCH: 27.8 pg (ref 26.6–33.0)
MCHC: 32.7 g/dL (ref 31.5–35.7)
MCV: 85 fL (ref 79–97)
Monocytes Absolute: 0.5 10*3/uL (ref 0.1–0.9)
Monocytes: 7 %
Neutrophils Absolute: 4.5 10*3/uL (ref 1.4–7.0)
Neutrophils: 63 %
Platelets: 247 10*3/uL (ref 150–450)
RBC: 4.97 x10E6/uL (ref 3.77–5.28)
RDW: 13.8 % (ref 11.7–15.4)
WBC: 7.1 10*3/uL (ref 3.4–10.8)

## 2019-09-29 LAB — CMP14+EGFR
ALT: 8 IU/L (ref 0–32)
AST: 15 IU/L (ref 0–40)
Albumin/Globulin Ratio: 1.8 (ref 1.2–2.2)
Albumin: 4.2 g/dL (ref 3.7–4.7)
Alkaline Phosphatase: 156 IU/L — ABNORMAL HIGH (ref 39–117)
BUN/Creatinine Ratio: 16 (ref 12–28)
BUN: 15 mg/dL (ref 8–27)
Bilirubin Total: 0.5 mg/dL (ref 0.0–1.2)
CO2: 24 mmol/L (ref 20–29)
Calcium: 9.3 mg/dL (ref 8.7–10.3)
Chloride: 105 mmol/L (ref 96–106)
Creatinine, Ser: 0.95 mg/dL (ref 0.57–1.00)
GFR calc Af Amer: 69 mL/min/{1.73_m2} (ref >59)
GFR calc non Af Amer: 60 mL/min/{1.73_m2} (ref >59)
Globulin, Total: 2.3 g/dL (ref 1.5–4.5)
Glucose: 89 mg/dL (ref 65–99)
Potassium: 4 mmol/L (ref 3.5–5.2)
Sodium: 142 mmol/L (ref 134–144)
Total Protein: 6.5 g/dL (ref 6.0–8.5)

## 2019-09-29 LAB — THYROID PANEL WITH TSH
Free Thyroxine Index: 1.8 (ref 1.2–4.9)
T3 Uptake Ratio: 27 % (ref 24–39)
T4, Total: 6.7 ug/dL (ref 4.5–12.0)
TSH: 4.56 u[IU]/mL — ABNORMAL HIGH (ref 0.450–4.500)

## 2019-09-29 LAB — LIPID PANEL
Chol/HDL Ratio: 3.6 ratio (ref 0.0–4.4)
Cholesterol, Total: 177 mg/dL (ref 100–199)
HDL: 49 mg/dL (ref >39)
LDL Chol Calc (NIH): 116 mg/dL — ABNORMAL HIGH (ref 0–99)
Triglycerides: 62 mg/dL (ref 0–149)
VLDL Cholesterol Cal: 12 mg/dL (ref 5–40)

## 2019-10-13 DIAGNOSIS — Z23 Encounter for immunization: Secondary | ICD-10-CM | POA: Diagnosis not present

## 2019-10-17 ENCOUNTER — Ambulatory Visit (INDEPENDENT_AMBULATORY_CARE_PROVIDER_SITE_OTHER): Payer: Medicare Other

## 2019-10-17 DIAGNOSIS — Z0000 Encounter for general adult medical examination without abnormal findings: Secondary | ICD-10-CM

## 2019-10-17 NOTE — Progress Notes (Addendum)
MEDICARE ANNUAL WELLNESS VISIT  10/17/2019  Telephone Visit Disclaimer This Medicare AWV was conducted by telephone due to national recommendations for restrictions regarding the COVID-19 Pandemic (e.g. social distancing).  I verified, using two identifiers, that I am speaking with [name] or their authorized healthcare agent. I discussed the limitations, risks, security, and privacy concerns of performing an evaluation and management service by telephone and the potential availability of an in-person appointment in the future. The patient expressed understanding and agreed to proceed.   Subjective:  Meredith Ruiz is a 74 y.o. female patient of Bennie Pierini, FNP who had a Medicare Annual Wellness Visit today via telephone. Tylee is Retired and lives with her two sons. She has two children. She reports that she is socially active and does interact with friends/family regularly. She is not physically active and enjoys watching tv, especially soap operas.  Patient Care Team: Bennie Pierini, FNP as PCP - General (Nurse Practitioner)  Advanced Directives 10/17/2019 09/03/2016  Does Patient Have a Medical Advance Directive? No No  Would patient like information on creating a medical advance directive? No - Patient declined No - Patient declined    Hospital Utilization Over the Past 12 Months: # of hospitalizations or ER visits: 0 # of surgeries: 0  Review of Systems    Patient reports that her overall health is better compared to last year.  History obtained from chart review and the patient  Patient Reported Readings (BP, Pulse, CBG, Weight, etc) None, patient has not checked recently.  Pain Assessment Pain : No/denies pain     Current Medications & Allergies (verified) Allergies as of 10/17/2019       Reactions   Sulfa Antibiotics Rash        Medication List        Accurate as of October 17, 2019 10:34 AM. If you have any questions, ask your  nurse or doctor.          aspirin 81 MG tablet Take 81 mg by mouth daily.   diclofenac sodium 1 % Gel Commonly known as: VOLTAREN Apply 4 g topically 4 (four) times daily. (knee pain). Ok to give OTC version if not covered by insurance   Fluocinonide 0.1 % Crea APPLY CREAM TOPICALLY TWICE DAILY   fluticasone 0.05 % cream Commonly known as: CUTIVATE Apply topically 2 (two) times daily.   levothyroxine 25 MCG tablet Commonly known as: SYNTHROID TAKE 1 TABLET BY MOUTH ONCE DAILY BEFORE BREAKFAST   lisinopril 20 MG tablet Commonly known as: ZESTRIL TAKE 1 TABLET BY MOUTH ONCE DAILY (NEEDS TO BE SEEN BEFORE NEXT REFILL)        History (reviewed): Past Medical History:  Diagnosis Date   Hypertension    Thyroid disease    Past Surgical History:  Procedure Laterality Date   CESAREAN SECTION     Family History  Problem Relation Age of Onset   Diabetes Mother    Heart attack Father    Heart disease Father    Diabetes Sister    Celiac disease Son    Arthritis Sister    Social History   Socioeconomic History   Marital status: Legally Separated    Spouse name: Not on file   Number of children: 2   Years of education: 9   Highest education level: 9th grade  Occupational History   Occupation: Retired  Tobacco Use   Smoking status: Former Smoker    Packs/day: 0.25    Years:  0.50    Pack years: 0.12    Quit date: 11/21/1973    Years since quitting: 45.9   Smokeless tobacco: Never Used  Substance and Sexual Activity   Alcohol use: No   Drug use: Never   Sexual activity: Not Currently  Other Topics Concern   Not on file  Social History Narrative   Not on file   Social Determinants of Health   Financial Resource Strain:    Difficulty of Paying Living Expenses: Not on file  Food Insecurity:    Worried About [place] in the Last Year: Not on file   [employer] of Food in the Last Year: Not on file  Transportation Needs:    Lack of Transportation  (Medical): Not on file   Lack of Transportation (Non-Medical): Not on file  Physical Activity:    Days of Exercise per Week: Not on file   Minutes of Exercise per Session: Not on file  Stress:    Feeling of Stress : Not on file  Social Connections:    Frequency of Communication with Friends and Family: Not on file   Frequency of Social Gatherings with Friends and Family: Not on file   Attends Religious Services: Not on file   Active Member of Clubs or Organizations: Not on file   Attends Archivist Meetings: Not on file   Marital Status: Not on file    Activities of Daily Living In your present state of health, do you have any difficulty performing the following activities: 10/17/2019  Hearing? N  Vision? N  Difficulty concentrating or making decisions? N  Walking or climbing stairs? N  Dressing or bathing? N  Doing errands, shopping? N  Preparing Food and eating ? N  Using the Toilet? N  In the past six months, have you accidently leaked urine? N  Do you have problems with loss of bowel control? N  Managing your Medications? N  Managing your Finances? N  Housekeeping or managing your Housekeeping? N  Some recent data might be hidden    Patient Education/ Literacy How often do you need to have someone help you when you read instructions, pamphlets, or other written materials from your doctor or pharmacy?: 1 - Never What is the last grade level you completed in school?: 9th  Exercise Current Exercise Habits: Home exercise routine, Type of exercise: walking, Time (Minutes): 10, Frequency (Times/Week): 2, Weekly Exercise (Minutes/Week): 20, Intensity: Mild, Exercise limited by: None identified  Diet Patient reports consuming 2 meals a day and 1 snack(s) a day Patient reports that her primary diet is: Regular Patient reports that she does have regular access to food.   Depression Screen PHQ 2/9 Scores 10/17/2019 09/28/2019 05/03/2019 03/28/2019 07/12/2018 01/11/2018  11/18/2017  PHQ - 2 Score 0 0 0 0 0 0 0     Fall Risk Fall Risk  10/17/2019 09/28/2019 05/03/2019 03/28/2019 07/12/2018  Falls in the past year? 0 0 0 0 0  Number falls in past yr: - - - - -  Injury with Fall? - - - - -  Comment - - - - -     Objective:  Carlis Stable Paone seemed alert and oriented and she participated appropriately during our telephone visit.  Blood Pressure Weight BMI  BP Readings from Last 3 Encounters:  09/28/19 136/72  05/03/19 126/77  03/28/19 (!) 142/78   Wt Readings from Last 3 Encounters:  09/28/19 186 lb (84.4 kg)  05/03/19 188 lb 3.2  oz (85.4 kg)  03/28/19 191 lb (86.6 kg)   BMI Readings from Last 1 Encounters:  09/28/19 30.95 kg/m    *Unable to obtain current vital signs, weight, and BMI due to telephone visit type  Hearing/Vision  Dailynn did not seem to have difficulty with hearing/understanding during the telephone conversation Reports that she has not had a formal eye exam by an eye care professional within the past year Reports that she has not had a formal hearing evaluation within the past year *Unable to fully assess hearing and vision during telephone visit type  Cognitive Function: 6CIT Screen 10/17/2019  What Year? 0 points  What month? 0 points  What time? 0 points  Count back from 20 0 points  Months in reverse 0 points  Repeat phrase 2 points  Total Score 2   (Normal:0-7, Significant for Dysfunction: >8)  Normal Cognitive Function Screening: Yes   Immunization & Health Maintenance Record Immunization History  Administered Date(s) Administered   Tdap 07/13/2016    Health Maintenance  Topic Date Due   INFLUENZA VACCINE  11/15/2019 (Originally 03/18/2019)   DEXA SCAN  09/27/2020 (Originally 12/22/2011)   Fecal DNA (Cologuard)  09/27/2020 (Originally 12/21/1996)   PNA vac Low Risk Adult (1 of 2 - PCV13) 09/27/2020 (Originally 12/22/2011)   MAMMOGRAM  05/16/2020   TETANUS/TDAP  07/13/2026   Hepatitis C Screening  Completed         Assessment  This is a routine wellness examination for Meredith Ruiz.  Health Maintenance: Due or Overdue There are no preventive care reminders to display for this patient.  Barbaraann Boys Haralson does not need a referral for [employer] Assistance: Care Management:   no Social Work:    no Prescription Assistance:  no Nutrition/Diabetes Education:  no   Plan:  Personalized Goals Goals Addressed   None    Personalized Health Maintenance & Screening Recommendations  Pneumococcal vaccine  Screening mammography Colorectal cancer screening  Lung Cancer Screening Recommended: no (Low Dose CT Chest recommended if Age 44-80 years, 30 pack-year currently smoking OR have quit w/in past 15 years) Hepatitis C Screening recommended: no HIV Screening recommended: no  Advanced Directives: Written information was not prepared per patient's request.  Referrals & Orders No orders of the defined types were placed in this encounter.   Follow-up Plan Follow-up with Bennie Pierini, FNP as planned Schedule colon cancer screening Schedule eye exam Schedule hearing exam Schedule Dexa Scan     I have personally reviewed and noted the following in the patient's chart:   Medical and social history Use of alcohol, tobacco or illicit drugs  Current medications and supplements Functional ability and status Nutritional status Physical activity Advanced directives List of other physicians Hospitalizations, surgeries, and ER visits in previous 12 months Vitals Screenings to include cognitive, depression, and falls Referrals and appointments  In addition, I have reviewed and discussed with [name] certain preventive protocols, quality metrics, and best practice recommendations. A written personalized care plan for preventive services as well as general preventive health recommendations is available and can be mailed to the patient at her request.      Mariam Dollar,  LPN    03/21/1659   I have reviewed and agree with the above AWV documentation.   Jannifer Rodney, FNP

## 2019-10-24 ENCOUNTER — Other Ambulatory Visit: Payer: Self-pay | Admitting: Nurse Practitioner

## 2019-11-08 ENCOUNTER — Telehealth: Payer: Self-pay | Admitting: Nurse Practitioner

## 2019-11-08 NOTE — Chronic Care Management (AMB) (Signed)
  Chronic Care Management   Outreach Note  11/08/2019 Name: Meredith Ruiz MRN: [id number] DOB: [birth year]  [name] is a 74 y.o. year old female who is a primary care patient of Bennie Pierini, FNP. I reached out to [name] by phone today in response to a referral sent by [name]'s health plan.     An unsuccessful telephone outreach was attempted today. The patient was referred to the case management team for assistance with care management and care coordination.   Follow Up Plan: The care management team will reach out to the patient again over the next 7 days.  If patient returns call to provider office, please advise to call Embedded Care Management Care Guide Gwenevere Ghazi* at [phone].Gwenevere Ghazi  Care Guide, Embedded Care Coordination [facility]  [place], [place] [address] Direct Dial: [phone] [email or web address] Website: [email or web address]

## 2019-11-10 DIAGNOSIS — Z23 Encounter for immunization: Secondary | ICD-10-CM | POA: Diagnosis not present

## 2019-11-13 NOTE — Chronic Care Management (AMB) (Signed)
  Chronic Care Management   Outreach Note  11/13/2019 Name: Meredith Ruiz MRN: [id number] DOB: [birth year]  Meredith Ruiz is a 74 y.o. year old female who is a primary care patient of Bennie Pierini, FNP. I reached out to Meredith Ruiz by phone today in response to a referral sent by Ms. Barbaraann Boys [place]'s health plan.     A second unsuccessful telephone outreach was attempted today. The patient was referred to the case management team for assistance with care management and care coordination.   Follow Up Plan: The care management team will reach out to the patient again over the next 7 days. If patient returns call to provider office, please advise to call Embedded Care Management Care Guide Gwenevere Ghazi at [phone].  Gwenevere Ghazi  Care Guide, Embedded Care Coordination [facility]  [place], [place] [address] Direct Dial: [phone] [email or web address] Website: [email or web address]

## 2019-11-15 NOTE — Chronic Care Management (AMB) (Signed)
  Chronic Care Management   Outreach Note  11/15/2019 Name: Meredith Ruiz MRN: [id number] DOB: 1946/08/01  Meredith Ruiz is a 74 y.o. year old female who is a primary care patient of Bennie Pierini, FNP. I reached out to Meredith Ruiz by phone today in response to a referral sent by [name]'s health plan.     Third unsuccessful telephone outreach was attempted today. The patient was referred to the case management team for assistance with care management and care coordination. The patient's primary care provider has been notified of our unsuccessful attempts to make or maintain contact with the patient. The care management team is pleased to engage with this patient at any time in the future should he/she be interested in assistance from the care management team.   Follow Up Plan: If patient returns call to provider office, please advise to call Embedded Care Management Care Guide Gwenevere Ghazi at [phone].  Gwenevere Ghazi  Care Guide, Embedded Care Coordination [facility]  [place], [place] [address] Direct Dial: [phone] [email or web address]: [email or web address]

## 2020-01-30 ENCOUNTER — Ambulatory Visit (INDEPENDENT_AMBULATORY_CARE_PROVIDER_SITE_OTHER): Payer: Medicare Other | Admitting: Nurse Practitioner

## 2020-01-30 ENCOUNTER — Other Ambulatory Visit (HOSPITAL_COMMUNITY)
Admission: RE | Admit: 2020-01-30 | Discharge: 2020-01-30 | Disposition: A | Discharge status: Home / Self Care | Payer: Medicare Other | Source: Ambulatory Visit | Attending: Nurse Practitioner | Admitting: Nurse Practitioner

## 2020-01-30 ENCOUNTER — Other Ambulatory Visit: Payer: Self-pay

## 2020-01-30 ENCOUNTER — Encounter: Payer: Self-pay | Admitting: Nurse Practitioner

## 2020-01-30 ENCOUNTER — Ambulatory Visit (INDEPENDENT_AMBULATORY_CARE_PROVIDER_SITE_OTHER): Payer: Medicare Other

## 2020-01-30 VITALS — BP 122/71 | HR 75 | Temp 98.0°F | Resp 20 | Ht 65.0 in | Wt 184.0 lb

## 2020-01-30 DIAGNOSIS — I10 Essential (primary) hypertension: Secondary | ICD-10-CM

## 2020-01-30 DIAGNOSIS — Z6832 Body mass index (BMI) 32.0-32.9, adult: Secondary | ICD-10-CM

## 2020-01-30 DIAGNOSIS — J90 Pleural effusion, not elsewhere classified: Secondary | ICD-10-CM | POA: Diagnosis not present

## 2020-01-30 DIAGNOSIS — J984 Other disorders of lung: Secondary | ICD-10-CM | POA: Diagnosis not present

## 2020-01-30 DIAGNOSIS — Z01419 Encounter for gynecological examination (general) (routine) without abnormal findings: Secondary | ICD-10-CM | POA: Diagnosis not present

## 2020-01-30 DIAGNOSIS — E039 Hypothyroidism, unspecified: Secondary | ICD-10-CM | POA: Diagnosis not present

## 2020-01-30 DIAGNOSIS — Z0000 Encounter for general adult medical examination without abnormal findings: Secondary | ICD-10-CM | POA: Diagnosis not present

## 2020-01-30 MED ORDER — LISINOPRIL 20 MG PO TABS: ORAL_TABLET | ORAL | 1 refills | Status: DC

## 2020-01-30 NOTE — Addendum Note (Signed)
Addended by: Leda Min D on: 01/30/2020 10:16 AM   Modules accepted: Orders

## 2020-01-30 NOTE — Progress Notes (Signed)
Subjective:    Patient ID: Meredith Ruiz, female    DOB: 1945/08/18, 74 y.o.   MRN: [id number]   Chief Complaint: Medical Management of Chronic Issues    HPI:  1. Annual physical exam Is here today for PAP. LMP was year sago.   2. Essential hypertension No c/o chest pain, sob or headache. Does not check blood pressure at home. BP Readings from Last 3 Encounters:  01/30/20 122/71  09/28/19 136/72  05/03/19 126/77     3. Acquired hypothyroidism No problems that she is aware of  4. BMI 32.0-32.9,adult No recent weight changes      Outpatient Encounter Medications as of 01/30/2020  Medication Sig   aspirin 81 MG tablet Take 81 mg by mouth daily.   diclofenac sodium (VOLTAREN) 1 % GEL Apply 4 g topically 4 (four) times daily. (knee pain). Ok to give OTC version if not covered by insurance   Fluocinonide 0.1 % CREA APPLY CREAM TOPICALLY TWICE DAILY   fluticasone (CUTIVATE) 0.05 % cream APPLY  CREAM TOPICALLY TWICE DAILY   levothyroxine (SYNTHROID) 25 MCG tablet TAKE 1 TABLET BY MOUTH ONCE DAILY BEFORE BREAKFAST   lisinopril (ZESTRIL) 20 MG tablet TAKE 1 TABLET BY MOUTH ONCE DAILY (NEEDS TO BE SEEN BEFORE NEXT REFILL)     Past Surgical History:  Procedure Laterality Date   CESAREAN SECTION      Family History  Problem Relation Age of Onset   Diabetes Mother    Heart attack Father    Heart disease Father    Diabetes Sister    Celiac disease Son    Arthritis Sister     New complaints: None today  Social history: Her sons live with her  Controlled substance contract: n/a    Review of Systems  Constitutional: Negative for diaphoresis.  Eyes: Negative for pain.  Respiratory: Negative for shortness of breath.   Cardiovascular: Negative for chest pain, palpitations and leg swelling.  Gastrointestinal: Negative for abdominal pain.  Endocrine: Negative for polydipsia.  Skin: Negative for rash.  Neurological: Negative for dizziness, weakness  and headaches.  Hematological: Does not bruise/bleed easily.  All other systems reviewed and are negative.      Objective:   Physical Exam Vitals and nursing note reviewed.  Constitutional:      General: She is not in acute distress.    Appearance: Normal appearance. She is well-developed.  HENT:     Head: Normocephalic.     Nose: Nose normal.  Eyes:     Pupils: Pupils are equal, round, and reactive to light.  Neck:     Vascular: No carotid bruit or JVD.  Cardiovascular:     Rate and Rhythm: Normal rate and regular rhythm.     Heart sounds: Normal heart sounds.  Pulmonary:     Effort: Pulmonary effort is normal. No respiratory distress.     Breath sounds: Normal breath sounds. No wheezing or rales.  Chest:     Chest wall: No tenderness.     Breasts:        Right: Normal. No swelling, bleeding, inverted nipple, mass, nipple discharge, skin change or tenderness.        Left: Normal. No swelling, bleeding, inverted nipple, mass, nipple discharge, skin change or tenderness.  Abdominal:     General: Bowel sounds are normal. There is no distension or abdominal bruit.     Palpations: Abdomen is soft. There is no hepatomegaly, splenomegaly, mass or pulsatile mass.  Tenderness: There is no abdominal tenderness.  Genitourinary:    General: Normal vulva.     Vagina: No vaginal discharge.     Rectum: Normal.     Comments: Cervix parous and pink No adnexal masses or tenderness Musculoskeletal:        General: Normal range of motion.     Cervical back: Normal range of motion and neck supple.  Lymphadenopathy:     Cervical: No cervical adenopathy.  Skin:    General: Skin is warm and dry.  Neurological:     Mental Status: She is alert and oriented to person, place, and time.     Deep Tendon Reflexes: Reflexes are normal and symmetric.  Psychiatric:        Behavior: Behavior normal.        Thought Content: Thought content normal.        Judgment: Judgment normal.    BP  122/71    Pulse 75    Temp 98 F (36.7 C) (Temporal)    Resp 20    Ht _0  (1.651 m)    Wt 184 lb (83.5 kg)    SpO2 94%    BMI 30.62 kg/m   Chest xray- no acute or chronic cardiopulmonary abnormalities-Preliminary reading by Meredith Collum, FNP  [facility]- will have over read by radiologist   EKG- NSR-Meredith Hassell Done, FNP     Assessment & Plan:  Meredith Ruiz comes in today with chief complaint of Medical Management of Chronic Issues   Diagnosis and orders addressed:  1. Annual physical exam  2. Essential hypertension Low sodium diet - CBC with Differential/Platelet - CMP14+EGFR - Lipid panel - lisinopril (ZESTRIL) 20 MG tablet; TAKE 1 TABLET BY MOUTH ONCE DAILY (NEEDS TO BE SEEN BEFORE NEXT REFILL)  Dispense: 90 tablet; Refill: 1  3. Acquired hypothyroidism Labs pending - Thyroid Panel With TSH  4. BMI 32.0-32.9,adult Discussed diet and exercise for person with BMI >25 Will recheck weight in 3-6 months  5. Gynecologic exam normal - Cytology - PAP   Labs pending Health Maintenance reviewed- mammogram scheduled Diet and exercise encouraged  Follow up plan: 6 months   [place], FNP

## 2020-01-30 NOTE — Patient Instructions (Signed)

## 2020-01-31 LAB — CBC WITH DIFFERENTIAL/PLATELET
Basophils Absolute: 0 10*3/uL (ref 0.0–0.2)
Basos: 1 %
EOS (ABSOLUTE): 0.1 10*3/uL (ref 0.0–0.4)
Eos: 2 %
Hematocrit: 43.2 % (ref 34.0–46.6)
Hemoglobin: 14 g/dL (ref 11.1–15.9)
Immature Grans (Abs): 0 10*3/uL (ref 0.0–0.1)
Immature Granulocytes: 1 %
Lymphocytes Absolute: 1.6 10*3/uL (ref 0.7–3.1)
Lymphs: 26 %
MCH: 27.5 pg (ref 26.6–33.0)
MCHC: 32.4 g/dL (ref 31.5–35.7)
MCV: 85 fL (ref 79–97)
Monocytes Absolute: 0.4 10*3/uL (ref 0.1–0.9)
Monocytes: 7 %
Neutrophils Absolute: 4 10*3/uL (ref 1.4–7.0)
Neutrophils: 63 %
Platelets: 252 10*3/uL (ref 150–450)
RBC: 5.09 x10E6/uL (ref 3.77–5.28)
RDW: 13.3 % (ref 11.7–15.4)
WBC: 6.2 10*3/uL (ref 3.4–10.8)

## 2020-01-31 LAB — CMP14+EGFR
ALT: 10 IU/L (ref 0–32)
AST: 13 IU/L (ref 0–40)
Albumin/Globulin Ratio: 1.7 (ref 1.2–2.2)
Albumin: 4.3 g/dL (ref 3.7–4.7)
Alkaline Phosphatase: 147 IU/L — ABNORMAL HIGH (ref 48–121)
BUN/Creatinine Ratio: 21 (ref 12–28)
BUN: 19 mg/dL (ref 8–27)
Bilirubin Total: 0.4 mg/dL (ref 0.0–1.2)
CO2: 25 mmol/L (ref 20–29)
Calcium: 9.2 mg/dL (ref 8.7–10.3)
Chloride: 103 mmol/L (ref 96–106)
Creatinine, Ser: 0.91 mg/dL (ref 0.57–1.00)
GFR calc Af Amer: 72 mL/min/{1.73_m2} (ref >59)
GFR calc non Af Amer: 63 mL/min/{1.73_m2} (ref >59)
Globulin, Total: 2.6 g/dL (ref 1.5–4.5)
Glucose: 99 mg/dL (ref 65–99)
Potassium: 4 mmol/L (ref 3.5–5.2)
Sodium: 141 mmol/L (ref 134–144)
Total Protein: 6.9 g/dL (ref 6.0–8.5)

## 2020-01-31 LAB — LIPID PANEL
Chol/HDL Ratio: 4.1 ratio (ref 0.0–4.4)
Cholesterol, Total: 192 mg/dL (ref 100–199)
HDL: 47 mg/dL (ref >39)
LDL Chol Calc (NIH): 132 mg/dL — ABNORMAL HIGH (ref 0–99)
Triglycerides: 69 mg/dL (ref 0–149)
VLDL Cholesterol Cal: 13 mg/dL (ref 5–40)

## 2020-01-31 LAB — CYTOLOGY - PAP
Adequacy: ABSENT
Diagnosis: NEGATIVE

## 2020-01-31 LAB — THYROID PANEL WITH TSH
Free Thyroxine Index: 1.9 (ref 1.2–4.9)
T3 Uptake Ratio: 27 % (ref 24–39)
T4, Total: 7 ug/dL (ref 4.5–12.0)
TSH: 4.03 u[IU]/mL (ref 0.450–4.500)

## 2020-02-29 ENCOUNTER — Other Ambulatory Visit: Payer: Self-pay | Admitting: Nurse Practitioner

## 2020-02-29 DIAGNOSIS — Z1231 Encounter for screening mammogram for malignant neoplasm of breast: Secondary | ICD-10-CM

## 2020-03-06 ENCOUNTER — Telehealth: Payer: Self-pay | Admitting: Nurse Practitioner

## 2020-03-06 NOTE — Telephone Encounter (Signed)
  Prescription Request  03/06/2020  What is the name of the medication or equipment? Ketoconazole Cream & Fluticasone Cream  Have you contacted your pharmacy to request a refill? (if applicable) Yes  Which pharmacy would you like this sent to? [facility]   Patient notified that their request is being sent to the clinical staff for review and that they should receive a response within 2 business days.   MMM's pt.  Please call pt.  Rash will not go away.

## 2020-03-06 NOTE — Telephone Encounter (Signed)
Ketoconazole cream not on current med list Fluticasone cream last RF 10/24/19 Last Ov 01/30/20 Next OV 08/05/20 Please advise

## 2020-03-07 MED ORDER — FLUTICASONE PROPIONATE 0.05 % EX CREA: TOPICAL_CREAM | CUTANEOUS | 2 refills | Status: DC

## 2020-03-07 NOTE — Telephone Encounter (Signed)
Pt requesting ketoconazole cream to be sent in as well to [facility].

## 2020-03-27 ENCOUNTER — Ambulatory Visit: Payer: Medicare Other | Admitting: Nurse Practitioner

## 2020-04-10 ENCOUNTER — Ambulatory Visit
Admission: RE | Admit: 2020-04-10 | Discharge: 2020-04-10 | Disposition: A | Discharge status: Home / Self Care | Payer: Medicare Other | Source: Ambulatory Visit | Attending: Nurse Practitioner | Admitting: Nurse Practitioner

## 2020-04-10 ENCOUNTER — Other Ambulatory Visit: Payer: Self-pay

## 2020-04-10 DIAGNOSIS — Z1231 Encounter for screening mammogram for malignant neoplasm of breast: Secondary | ICD-10-CM | POA: Diagnosis not present

## 2020-06-21 DIAGNOSIS — Z23 Encounter for immunization: Secondary | ICD-10-CM | POA: Diagnosis not present

## 2020-07-19 ENCOUNTER — Telehealth: Payer: Self-pay

## 2020-07-19 NOTE — Telephone Encounter (Signed)
Pt called stating that she received a missed call from [facility].  Explained that it was most likely our automated system calling to let her know that based on our records, pt is overdue for 2nd COVID shot and flu shot.  Pt said that she has already had her 2nd and booster covid shot at [facility] and does not take the flu shot

## 2020-07-28 ENCOUNTER — Other Ambulatory Visit: Payer: Self-pay | Admitting: Nurse Practitioner

## 2020-07-28 DIAGNOSIS — E039 Hypothyroidism, unspecified: Secondary | ICD-10-CM

## 2020-08-02 ENCOUNTER — Ambulatory Visit: Payer: Medicare Other | Admitting: Nurse Practitioner

## 2020-08-05 ENCOUNTER — Other Ambulatory Visit: Payer: Self-pay

## 2020-08-05 ENCOUNTER — Encounter: Payer: Self-pay | Admitting: Nurse Practitioner

## 2020-08-05 ENCOUNTER — Ambulatory Visit (INDEPENDENT_AMBULATORY_CARE_PROVIDER_SITE_OTHER): Payer: Medicare Other | Admitting: Nurse Practitioner

## 2020-08-05 VITALS — BP 146/81 | HR 83 | Temp 98.1°F | Resp 20 | Ht 65.0 in | Wt 185.0 lb

## 2020-08-05 DIAGNOSIS — E039 Hypothyroidism, unspecified: Secondary | ICD-10-CM | POA: Diagnosis not present

## 2020-08-05 DIAGNOSIS — B372 Candidiasis of skin and nail: Secondary | ICD-10-CM | POA: Diagnosis not present

## 2020-08-05 DIAGNOSIS — Z6832 Body mass index (BMI) 32.0-32.9, adult: Secondary | ICD-10-CM

## 2020-08-05 DIAGNOSIS — I10 Essential (primary) hypertension: Secondary | ICD-10-CM | POA: Diagnosis not present

## 2020-08-05 MED ORDER — FLUTICASONE PROPIONATE 0.05 % EX CREA: TOPICAL_CREAM | CUTANEOUS | 2 refills | Status: DC

## 2020-08-05 MED ORDER — LISINOPRIL 40 MG PO TABS: 40.0000 mg | ORAL_TABLET | Freq: Every day | ORAL | 3 refills | Status: DC

## 2020-08-05 MED ORDER — LEVOTHYROXINE SODIUM 25 MCG PO TABS: 25.0000 ug | ORAL_TABLET | Freq: Every day | ORAL | 1 refills | Status: DC

## 2020-08-05 MED ORDER — KETOCONAZOLE 2 % EX CREA: 1.0000 "application " | TOPICAL_CREAM | Freq: Every day | CUTANEOUS | 0 refills | Status: DC

## 2020-08-05 NOTE — Progress Notes (Signed)
Subjective:    Patient ID: Meredith Ruiz, female    DOB: Dec 23, 1945, 74 y.o.   MRN: [id number]   Chief Complaint: Medical Management of Chronic Issues    HPI:  1. Essential hypertension No c/o chest pain, sob or headache. Does not check blood pressure at home. BP Readings from Last 3 Encounters:  08/05/20 (!) 146/81  01/30/20 122/71  09/28/19 136/72     2. Acquired hypothyroidism No problems that shs is aware of. Lab Results  Component Value Date   TSH 4.030 01/30/2020     3. BMI 32.0-32.9,adult No recent weight changes Wt Readings from Last 3 Encounters:  08/05/20 185 lb (83.9 kg)  01/30/20 184 lb (83.5 kg)  09/28/19 186 lb (84.4 kg)   BMI Readings from Last 3 Encounters:  08/05/20 30.79 kg/m  01/30/20 30.62 kg/m  09/28/19 30.95 kg/m       Outpatient Encounter Medications as of 08/05/2020  Medication Sig  . aspirin 81 MG tablet Take 81 mg by mouth daily.  . diclofenac sodium (VOLTAREN) 1 % GEL Apply 4 g topically 4 (four) times daily. (knee pain). Ok to give OTC version if not covered by insurance  . fluticasone (CUTIVATE) 0.05 % cream APPLY  CREAM TOPICALLY TWICE DAILY  . levothyroxine (SYNTHROID) 25 MCG tablet TAKE 1 TABLET BY MOUTH ONCE DAILY BEFORE BREAKFAST  . lisinopril (ZESTRIL) 20 MG tablet TAKE 1 TABLET BY MOUTH ONCE DAILY (NEEDS TO BE SEEN BEFORE NEXT REFILL)     Past Surgical History:  Procedure Laterality Date  . CESAREAN SECTION      Family History  Problem Relation Age of Onset  . Diabetes Mother   . Heart attack Father   . Heart disease Father   . Diabetes Sister   . Celiac disease Son   . Arthritis Sister     New complaints: Continues to have  arash under both breasts. She has had for years. Has creams that help.  Social history: Lives by herself  Controlled substance contract: n/a    Review of Systems  Constitutional: Negative for diaphoresis.  Eyes: Negative for pain.  Respiratory: Negative for shortness of  breath.   Cardiovascular: Negative for chest pain, palpitations and leg swelling.  Gastrointestinal: Negative for abdominal pain.  Endocrine: Negative for polydipsia.  Skin: Positive for rash (bil mammory fold).  Neurological: Negative for dizziness, weakness and headaches.  Hematological: Does not bruise/bleed easily.  All other systems reviewed and are negative.      Objective:   Physical Exam Vitals and nursing note reviewed.  Constitutional:      General: She is not in acute distress.    Appearance: Normal appearance. She is well-developed and well-nourished.  HENT:     Head: Normocephalic.     Nose: Nose normal.     Mouth/Throat:     Mouth: Oropharynx is clear and moist.  Eyes:     Extraocular Movements: EOM normal.     Pupils: Pupils are equal, round, and reactive to light.  Neck:     Vascular: No carotid bruit or JVD.  Cardiovascular:     Rate and Rhythm: Normal rate and regular rhythm.     Pulses: Intact distal pulses.     Heart sounds: Normal heart sounds.  Pulmonary:     Effort: Pulmonary effort is normal. No respiratory distress.     Breath sounds: Normal breath sounds. No wheezing or rales.  Chest:     Chest wall: No tenderness.  Abdominal:  General: Bowel sounds are normal. There is no distension or abdominal bruit. Aorta is normal.     Palpations: Abdomen is soft. There is no hepatomegaly, splenomegaly, mass or pulsatile mass.     Tenderness: There is no abdominal tenderness.  Musculoskeletal:        General: No edema. Normal range of motion.     Cervical back: Normal range of motion and neck supple.  Lymphadenopathy:     Cervical: No cervical adenopathy.  Skin:    General: Skin is warm and dry.  Neurological:     Mental Status: She is alert and oriented to person, place, and time.     Deep Tendon Reflexes: Reflexes are normal and symmetric.  Psychiatric:        Mood and Affect: Mood and affect normal.        Behavior: Behavior normal.         Thought Content: Thought content normal.        Judgment: Judgment normal.    BP (!) 146/81   Pulse 83   Temp 98.1 F (36.7 C) (Temporal)   Resp 20   Ht _0  (1.651 m)   Wt 185 lb (83.9 kg)   SpO2 98%   BMI 30.79 kg/m          Assessment & Plan:  Meredith Ruiz comes in today with chief complaint of Medical Management of Chronic Issues   Diagnosis and orders addressed:  1. Essential hypertension Low sodium diet Increased lisinopril to 36m daily Keep diary of blood pressure - lisinopril (ZESTRIL) 40 MG tablet; Take 1 tablet (40 mg total) by mouth daily.  Dispense: 90 tablet; Refill: 3 - CBC with Differential/Platelet - CMP14+EGFR - Lipid panel  2. Acquired hypothyroidism Labs pending - levothyroxine (SYNTHROID) 25 MCG tablet; Take 1 tablet (25 mcg total) by mouth daily before breakfast.  Dispense: 90 tablet; Refill: 1 - Thyroid Panel With TSH  3. BMI 32.0-32.9,adult Discussed diet and exercise for person with BMI >25 Will recheck weight in 3-6 months  4. Cutaneous candidiasis Keep area clean and dry - fluticasone (CUTIVATE) 0.05 % cream; APPLY  CREAM TOPICALLY TWICE DAILY  Dispense: 30 g; Refill: 2 - ketoconazole (NIZORAL) 2 % cream; Apply 1 application topically daily.  Dispense: 15 g; Refill: 0   Labs pending Health Maintenance reviewed Diet and exercise encouraged  Follow up plan: 6 months   Meredith Ruiz

## 2020-08-05 NOTE — Patient Instructions (Signed)
DASH Eating Plan DASH stands for "Dietary Approaches to Stop Hypertension." The DASH eating plan is a healthy eating plan that has been shown to reduce high blood pressure (hypertension). It may also reduce your risk for type 2 diabetes, heart disease, and stroke. The DASH eating plan may also help with weight loss. What are tips for following this plan?  General guidelines  Avoid eating more than 2,300 mg (milligrams) of salt (sodium) a day. If you have hypertension, you may need to reduce your sodium intake to 1,500 mg a day.  Limit alcohol intake to no more than 1 drink a day for nonpregnant women and 2 drinks a day for men. One drink equals 12 oz of beer, 5 oz of wine, or 1 oz of hard liquor.  Work with your health care provider to maintain a healthy body weight or to lose weight. Ask what an ideal weight is for you.  Get at least 30 minutes of exercise that causes your heart to beat faster (aerobic exercise) most days of the week. Activities may include walking, swimming, or biking.  Work with your health care provider or diet and nutrition specialist (dietitian) to adjust your eating plan to your individual calorie needs. Reading food labels   Check food labels for the amount of sodium per serving. Choose foods with less than 5 percent of the Daily Value of sodium. Generally, foods with less than 300 mg of sodium per serving fit into this eating plan.  To find whole grains, look for the word "whole" as the first word in the ingredient list. Shopping  Buy products labeled as "low-sodium" or "no salt added."  Buy fresh foods. Avoid canned foods and premade or frozen meals. Cooking  Avoid adding salt when cooking. Use salt-free seasonings or herbs instead of table salt or sea salt. Check with your health care provider or pharmacist before using salt substitutes.  Do not fry foods. Cook foods using healthy methods such as baking, boiling, grilling, and broiling instead.  Cook with  heart-healthy oils, such as olive, canola, soybean, or sunflower oil. Meal planning  Eat a balanced diet that includes: ? 5 or more servings of fruits and vegetables each day. At each meal, try to fill half of your plate with fruits and vegetables. ? Up to 6-8 servings of whole grains each day. ? Less than 6 oz of lean meat, poultry, or fish each day. A 3-oz serving of meat is about the same size as a deck of cards. One egg equals 1 oz. ? 2 servings of low-fat dairy each day. ? A serving of nuts, seeds, or beans 5 times each week. ? Heart-healthy fats. Healthy fats called Omega-3 fatty acids are found in foods such as flaxseeds and coldwater fish, like sardines, salmon, and mackerel.  Limit how much you eat of the following: ? Canned or prepackaged foods. ? Food that is high in trans fat, such as fried foods. ? Food that is high in saturated fat, such as fatty meat. ? Sweets, desserts, sugary drinks, and other foods with added sugar. ? Full-fat dairy products.  Do not salt foods before eating.  Try to eat at least 2 vegetarian meals each week.  Eat more home-cooked food and less restaurant, buffet, and fast food.  When eating at a restaurant, ask that your food be prepared with less salt or no salt, if possible. What foods are recommended? The items listed may not be a complete list. Talk with your dietitian about   what dietary choices are best for you. Grains Whole-grain or whole-wheat bread. Whole-grain or whole-wheat pasta. Brown rice. Oatmeal. Quinoa. Bulgur. Whole-grain and low-sodium cereals. Pita bread. Low-fat, low-sodium crackers. Whole-wheat flour tortillas. Vegetables Fresh or frozen vegetables (raw, steamed, roasted, or grilled). Low-sodium or reduced-sodium tomato and vegetable juice. Low-sodium or reduced-sodium tomato sauce and tomato paste. Low-sodium or reduced-sodium canned vegetables. Fruits All fresh, dried, or frozen fruit. Canned fruit in natural juice (without  added sugar). Meat and other protein foods Skinless chicken or turkey. Ground chicken or turkey. Pork with fat trimmed off. Fish and seafood. Egg whites. Dried beans, peas, or lentils. Unsalted nuts, nut butters, and seeds. Unsalted canned beans. Lean cuts of beef with fat trimmed off. Low-sodium, lean deli meat. Dairy Low-fat (1%) or fat-free (skim) milk. Fat-free, low-fat, or reduced-fat cheeses. Nonfat, low-sodium ricotta or cottage cheese. Low-fat or nonfat yogurt. Low-fat, low-sodium cheese. Fats and oils Soft margarine without trans fats. Vegetable oil. Low-fat, reduced-fat, or light mayonnaise and salad dressings (reduced-sodium). Canola, safflower, olive, soybean, and sunflower oils. Avocado. Seasoning and other foods Herbs. Spices. Seasoning mixes without salt. Unsalted popcorn and pretzels. Fat-free sweets. What foods are not recommended? The items listed may not be a complete list. Talk with your dietitian about what dietary choices are best for you. Grains Baked goods made with fat, such as croissants, muffins, or some breads. Dry pasta or rice meal packs. Vegetables Creamed or fried vegetables. Vegetables in a cheese sauce. Regular canned vegetables (not low-sodium or reduced-sodium). Regular canned tomato sauce and paste (not low-sodium or reduced-sodium). Regular tomato and vegetable juice (not low-sodium or reduced-sodium). Pickles. Olives. Fruits Canned fruit in a light or heavy syrup. Fried fruit. Fruit in cream or butter sauce. Meat and other protein foods Fatty cuts of meat. Ribs. Fried meat. Bacon. Sausage. Bologna and other processed lunch meats. Salami. Fatback. Hotdogs. Bratwurst. Salted nuts and seeds. Canned beans with added salt. Canned or smoked fish. Whole eggs or egg yolks. Chicken or turkey with skin. Dairy Whole or 2% milk, cream, and half-and-half. Whole or full-fat cream cheese. Whole-fat or sweetened yogurt. Full-fat cheese. Nondairy creamers. Whipped toppings.  Processed cheese and cheese spreads. Fats and oils Butter. Stick margarine. Lard. Shortening. Ghee. Bacon fat. Tropical oils, such as coconut, palm kernel, or palm oil. Seasoning and other foods Salted popcorn and pretzels. Onion salt, garlic salt, seasoned salt, table salt, and sea salt. Worcestershire sauce. Tartar sauce. Barbecue sauce. Teriyaki sauce. Soy sauce, including reduced-sodium. Steak sauce. Canned and packaged gravies. Fish sauce. Oyster sauce. Cocktail sauce. Horseradish that you find on the shelf. Ketchup. Mustard. Meat flavorings and tenderizers. Bouillon cubes. Hot sauce and Tabasco sauce. Premade or packaged marinades. Premade or packaged taco seasonings. Relishes. Regular salad dressings. Where to find more information:  National Heart, Lung, and Blood Institute: www.nhlbi.nih.gov  American Heart Association: www.heart.org Summary  The DASH eating plan is a healthy eating plan that has been shown to reduce high blood pressure (hypertension). It may also reduce your risk for type 2 diabetes, heart disease, and stroke.  With the DASH eating plan, you should limit salt (sodium) intake to 2,300 mg a day. If you have hypertension, you may need to reduce your sodium intake to 1,500 mg a day.  When on the DASH eating plan, aim to eat more fresh fruits and vegetables, whole grains, lean proteins, low-fat dairy, and heart-healthy fats.  Work with your health care provider or diet and nutrition specialist (dietitian) to adjust your eating plan to your   individual calorie needs. This information is not intended to replace advice given to you by your health care provider. Make sure you discuss any questions you have with your health care provider. Document Revised: 07/16/2017 Document Reviewed: 07/27/2016 Elsevier Patient Education  2020 Elsevier Inc.  

## 2020-08-06 LAB — CBC WITH DIFFERENTIAL/PLATELET
Basophils Absolute: 0 10*3/uL (ref 0.0–0.2)
Basos: 1 %
EOS (ABSOLUTE): 0.1 10*3/uL (ref 0.0–0.4)
Eos: 1 %
Hematocrit: 43.7 % (ref 34.0–46.6)
Hemoglobin: 14.2 g/dL (ref 11.1–15.9)
Immature Grans (Abs): 0 10*3/uL (ref 0.0–0.1)
Immature Granulocytes: 0 %
Lymphocytes Absolute: 1.9 10*3/uL (ref 0.7–3.1)
Lymphs: 29 %
MCH: 27.6 pg (ref 26.6–33.0)
MCHC: 32.5 g/dL (ref 31.5–35.7)
MCV: 85 fL (ref 79–97)
Monocytes Absolute: 0.5 10*3/uL (ref 0.1–0.9)
Monocytes: 7 %
Neutrophils Absolute: 4.1 10*3/uL (ref 1.4–7.0)
Neutrophils: 62 %
Platelets: 262 10*3/uL (ref 150–450)
RBC: 5.14 x10E6/uL (ref 3.77–5.28)
RDW: 13.5 % (ref 11.7–15.4)
WBC: 6.6 10*3/uL (ref 3.4–10.8)

## 2020-08-06 LAB — THYROID PANEL WITH TSH
Free Thyroxine Index: 2 (ref 1.2–4.9)
T3 Uptake Ratio: 28 % (ref 24–39)
T4, Total: 7.2 ug/dL (ref 4.5–12.0)
TSH: 5.04 u[IU]/mL — ABNORMAL HIGH (ref 0.450–4.500)

## 2020-08-06 LAB — LIPID PANEL
Chol/HDL Ratio: 4.2 ratio (ref 0.0–4.4)
Cholesterol, Total: 198 mg/dL (ref 100–199)
HDL: 47 mg/dL (ref >39)
LDL Chol Calc (NIH): 136 mg/dL — ABNORMAL HIGH (ref 0–99)
Triglycerides: 81 mg/dL (ref 0–149)
VLDL Cholesterol Cal: 15 mg/dL (ref 5–40)

## 2020-08-06 LAB — CMP14+EGFR
ALT: 9 IU/L (ref 0–32)
AST: 14 IU/L (ref 0–40)
Albumin/Globulin Ratio: 1.6 (ref 1.2–2.2)
Albumin: 4.4 g/dL (ref 3.7–4.7)
Alkaline Phosphatase: 148 IU/L — ABNORMAL HIGH (ref 44–121)
BUN/Creatinine Ratio: 20 (ref 12–28)
BUN: 18 mg/dL (ref 8–27)
Bilirubin Total: 0.3 mg/dL (ref 0.0–1.2)
CO2: 22 mmol/L (ref 20–29)
Calcium: 9.5 mg/dL (ref 8.7–10.3)
Chloride: 103 mmol/L (ref 96–106)
Creatinine, Ser: 0.88 mg/dL (ref 0.57–1.00)
GFR calc Af Amer: 75 mL/min/{1.73_m2} (ref >59)
GFR calc non Af Amer: 65 mL/min/{1.73_m2} (ref >59)
Globulin, Total: 2.8 g/dL (ref 1.5–4.5)
Glucose: 116 mg/dL — ABNORMAL HIGH (ref 65–99)
Potassium: 4.2 mmol/L (ref 3.5–5.2)
Sodium: 140 mmol/L (ref 134–144)
Total Protein: 7.2 g/dL (ref 6.0–8.5)

## 2020-08-06 MED ORDER — LEVOTHYROXINE SODIUM 50 MCG PO TABS
50.0000 ug | ORAL_TABLET | Freq: Every day | ORAL | 3 refills | Status: DC
Start: 2020-08-06 — End: 2021-02-03

## 2020-08-06 NOTE — Addendum Note (Signed)
Addended by: Bennie Pierini on: 08/06/2020 04:00 PM   Modules accepted: Orders

## 2020-09-17 ENCOUNTER — Telehealth: Payer: Self-pay

## 2020-09-17 NOTE — Telephone Encounter (Signed)
Recording says not available

## 2020-10-24 ENCOUNTER — Ambulatory Visit: Payer: Medicare Other

## 2020-10-24 ENCOUNTER — Ambulatory Visit (INDEPENDENT_AMBULATORY_CARE_PROVIDER_SITE_OTHER): Payer: Medicare Other

## 2020-10-24 DIAGNOSIS — Z0000 Encounter for general adult medical examination without abnormal findings: Secondary | ICD-10-CM

## 2020-10-24 NOTE — Progress Notes (Signed)
MEDICARE ANNUAL WELLNESS VISIT  10/24/2020  Telephone Visit Disclaimer This Medicare AWV was conducted by telephone due to national recommendations for restrictions regarding the COVID-19 Pandemic (e.g. social distancing).  I verified, using two identifiers, that I am speaking with [name] or their authorized healthcare agent. I discussed the limitations, risks, security, and privacy concerns of performing an evaluation and management service by telephone and the potential availability of an in-person appointment in the future. The patient expressed understanding and agreed to proceed.  Location of Patient: Home Location of Provider (nurse):  [facility]  Subjective:    CHARICE ZUNO is a 75 y.o. female patient of Bennie Pierini, FNP who had a Medicare Annual Wellness Visit today via telephone. Jordanna is Retired and lives in her own home with her two sons. She has two children. She reports that she is socially active and does interact with friends/family regularly. She is  physically active and enjoys listening to music and watching television.  Patient Care Team: Bennie Pierini, FNP as PCP - General (Nurse Practitioner)  Advanced Directives 10/24/2020 10/17/2019 09/03/2016  Does Patient Have a Medical Advance Directive? No No No  Would patient like information on creating a medical advance directive? No - Patient declined No - Patient declined No - Patient declined    Hospital Utilization Over the Past 12 Months: # of hospitalizations or ER visits: 0 # of surgeries: 0  Review of Systems    Patient reports that her overall health is better compared to last year.  History obtained from chart review and the patient  Patient Reported Readings (BP, Pulse, CBG, Weight, etc) none  Pain Assessment Pain : No/denies pain     Current Medications & Allergies (verified) Allergies as of 10/24/2020      Reactions   Sulfa Antibiotics Rash      Medication List        Accurate as of October 24, 2020 10:02 AM. If you have any questions, ask your nurse or doctor.        aspirin 81 MG tablet Take 81 mg by mouth daily.   diclofenac sodium 1 % Gel Commonly known as: VOLTAREN Apply 4 g topically 4 (four) times daily. (knee pain). Ok to give OTC version if not covered by insurance   fluticasone 0.05 % cream Commonly known as: CUTIVATE APPLY  CREAM TOPICALLY TWICE DAILY   ketoconazole 2 % cream Commonly known as: NIZORAL Apply 1 application topically daily.   levothyroxine 50 MCG tablet Commonly known as: SYNTHROID Take 1 tablet (50 mcg total) by mouth daily.   lisinopril 40 MG tablet Commonly known as: ZESTRIL Take 1 tablet (40 mg total) by mouth daily.       History (reviewed): Past Medical History:  Diagnosis Date  . Hypertension   . Thyroid disease    Past Surgical History:  Procedure Laterality Date  . CESAREAN SECTION     Family History  Problem Relation Age of Onset  . Diabetes Mother   . Heart attack Father   . Heart disease Father   . Diabetes Sister   . Celiac disease Son   . Arthritis Sister    Social History   Socioeconomic History  . Marital status: Legally Separated    Spouse name: Not on file  . Number of children: 2  . Years of education: 9  . Highest education level: 9th grade  Occupational History  . Occupation: Retired  Tobacco Use  . Smoking status: Former  Smoker    Packs/day: 0.25    Years: 0.50    Pack years: 0.12    Quit date: 11/21/1973    Years since quitting: 46.9  . Smokeless tobacco: Never Used  Vaping Use  . Vaping Use: Never used  Substance and Sexual Activity  . Alcohol use: No  . Drug use: Never  . Sexual activity: Not Currently  Other Topics Concern  . Not on file  Social History Narrative  . Not on file   Social Determinants of Health   Financial Resource Strain: Not on file  Food Insecurity: Not on file  Transportation Needs: Not on file  Physical Activity: Not on file   Stress: Not on file  Social Connections: Not on file    Activities of Daily Living In your present state of health, do you have any difficulty performing the following activities: 10/24/2020  Hearing? N  Vision? N  Difficulty concentrating or making decisions? N  Walking or climbing stairs? N  Dressing or bathing? N  Doing errands, shopping? N  Preparing Food and eating ? N  Using the Toilet? N  In the past six months, have you accidently leaked urine? N  Do you have problems with loss of bowel control? N  Managing your Medications? N  Managing your Finances? N  Housekeeping or managing your Housekeeping? N  Some recent data might be hidden    Patient Education/ Literacy How often do you need to have someone help you when you read instructions, pamphlets, or other written materials from your doctor or pharmacy?: 1 - Never What is the last grade level you completed in school?: 9th grade  Exercise Current Exercise Habits: The patient does not participate in regular exercise at present, Exercise limited by: None identified  Diet Patient reports consuming 3 meals a day and 1 snack(s) a day Patient reports that her primary diet is: Regular Patient reports that she does have regular access to food.   Depression Screen PHQ 2/9 Scores 10/24/2020 08/05/2020 01/30/2020 10/17/2019 09/28/2019 05/03/2019 03/28/2019  PHQ - 2 Score 0 0 0 0 0 0 0     Fall Risk Fall Risk  10/24/2020 08/05/2020 01/30/2020 10/17/2019 09/28/2019  Falls in the past year? 0 0 0 0 0  Number falls in past yr: - - - - -  Injury with Fall? - - - - -  Comment - - - - -  Follow up Falls evaluation completed - - - -     Objective:  Barbaraann BoysMarinda T Dejonge seemed alert and oriented and she participated appropriately during our telephone visit.  Blood Pressure Weight BMI  BP Readings from Last 3 Encounters:  08/05/20 (!) 146/81  01/30/20 122/71  09/28/19 136/72   Wt Readings from Last 3 Encounters:  08/05/20 185 lb (83.9 kg)   01/30/20 184 lb (83.5 kg)  09/28/19 186 lb (84.4 kg)   BMI Readings from Last 1 Encounters:  08/05/20 30.79 kg/m    *Unable to obtain current vital signs, weight, and BMI due to telephone visit type  Hearing/Vision  . Marjo BickerMarinda did not seem to have difficulty with hearing/understanding during the telephone conversation . Reports that she has not had a formal eye exam by an eye care professional within the past year . Reports that she has not had a formal hearing evaluation within the past year *Unable to fully assess hearing and vision during telephone visit type  Cognitive Function: 6CIT Screen 10/24/2020 10/17/2019  What Year? 0 points 0 points  What month? 0 points 0 points  What time? 0 points 0 points  Count back from 20 0 points 0 points  Months in reverse 0 points 0 points  Repeat phrase 0 points 2 points  Total Score 0 2   (Normal:0-7, Significant for Dysfunction: >8)  Normal Cognitive Function Screening: Yes   Immunization & Health Maintenance Record Immunization History  Administered Date(s) Administered  . PFIZER(Purple Top)SARS-COV-2 Vaccination 11/10/2019  . Tdap 07/13/2016    Health Maintenance  Topic Date Due  . Fecal DNA (Cologuard)  Never done  . DEXA SCAN  Never done  . PNA vac Low Risk Adult (1 of 2 - PCV13) Never done  . COVID-19 Vaccine (2 - Pfizer 3-dose series) 12/01/2019  . INFLUENZA VACCINE  11/14/2020 (Originally 03/17/2020)  . MAMMOGRAM  04/10/2022  . TETANUS/TDAP  07/13/2026  . Hepatitis C Screening  Completed  . HPV VACCINES  Aged Out       Assessment  This is a routine wellness examination for GIA LUSHER.  Health Maintenance: Due or Overdue Health Maintenance Due  Topic Date Due  . Fecal DNA (Cologuard)  Never done  . DEXA SCAN  Never done  . PNA vac Low Risk Adult (1 of 2 - PCV13) Never done  . COVID-19 Vaccine (2 - Pfizer 3-dose series) 12/01/2019    [name] does not need a referral for Community Assistance: Care  Management:   no Social Work:    no Prescription Assistance:  no Nutrition/Diabetes Education:  no   Plan:  Personalized Goals Goals Addressed            This Visit's Progress   . Patient Stated       10/24/2020 AWV Goal: Exercise for General Health   Patient will verbalize understanding of the benefits of increased physical activity:  Exercising regularly is important. It will improve your overall fitness, flexibility, and endurance.  Regular exercise also will improve your overall health. It can help you control your weight, reduce stress, and improve your bone density.  Over the next year, patient will increase physical activity as tolerated with a goal of at least 150 minutes of moderate physical activity per week.   You can tell that you are exercising at a moderate intensity if your heart starts beating faster and you start breathing faster but can still hold a conversation.  Moderate-intensity exercise ideas include:  Walking 1 mile (1.6 km) in about 15 minutes  Biking  Hiking  Golfing  Dancing  Water aerobics  Patient will verbalize understanding of everyday activities that increase physical activity by providing examples like the following: ? Yard work, such as: ? Pushing a Surveyor, mining ? Raking and bagging leaves ? Washing your car ? Pushing a stroller ? Shoveling snow ? Gardening ? Washing windows or floors  Patient will be able to explain general safety guidelines for exercising:   Before you start a new exercise program, talk with your health care provider.  Do not exercise so much that you hurt yourself, feel dizzy, or get very short of breath.  Wear comfortable clothes and wear shoes with good support.  Drink plenty of water while you exercise to prevent dehydration or heat stroke.  Work out until your breathing and your heartbeat get faster.       Personalized Health Maintenance & Screening Recommendations  Pneumococcal vaccine  Bone  densitometry screening Colorectal cancer screening  Lung Cancer Screening Recommended: yes (Low Dose CT Chest recommended if  Age 53-80 years, 30 pack-year currently smoking OR have quit w/in past 15 years) Hepatitis C Screening recommended: no HIV Screening recommended: no  Advanced Directives: Written information was not prepared per patient's request.  Referrals & Orders No orders of the defined types were placed in this encounter.   Follow-up Plan . Follow-up with Bennie Pierini, FNP as planned . Schedule colonoscopy . Schedule dexa scan    I have personally reviewed and noted the following in the patient's chart:   . Medical and social history . Use of alcohol, tobacco or illicit drugs  . Current medications and supplements . Functional ability and status . Nutritional status . Physical activity . Advanced directives . List of other physicians . Hospitalizations, surgeries, and ER visits in previous 12 months . Vitals . Screenings to include cognitive, depression, and falls . Referrals and appointments  In addition, I have reviewed and discussed with [name] certain preventive protocols, quality metrics, and best practice recommendations. A written personalized care plan for preventive services as well as general preventive health recommendations is available and can be mailed to the patient at her request.      Mariam Dollar, LPN    [id number]/9449  Patient declined after visit summary

## 2020-12-09 DIAGNOSIS — Z23 Encounter for immunization: Secondary | ICD-10-CM | POA: Diagnosis not present

## 2021-01-27 ENCOUNTER — Other Ambulatory Visit: Payer: Self-pay | Admitting: Nurse Practitioner

## 2021-01-27 DIAGNOSIS — B372 Candidiasis of skin and nail: Secondary | ICD-10-CM

## 2021-02-03 ENCOUNTER — Other Ambulatory Visit: Payer: Self-pay

## 2021-02-03 ENCOUNTER — Encounter: Payer: Self-pay | Admitting: Nurse Practitioner

## 2021-02-03 ENCOUNTER — Ambulatory Visit (INDEPENDENT_AMBULATORY_CARE_PROVIDER_SITE_OTHER): Payer: Medicare Other | Admitting: Nurse Practitioner

## 2021-02-03 VITALS — BP 122/63 | HR 58 | Temp 98.0°F | Resp 20 | Ht 65.0 in | Wt 185.0 lb

## 2021-02-03 DIAGNOSIS — I10 Essential (primary) hypertension: Secondary | ICD-10-CM

## 2021-02-03 DIAGNOSIS — Z6830 Body mass index (BMI) 30.0-30.9, adult: Secondary | ICD-10-CM | POA: Diagnosis not present

## 2021-02-03 DIAGNOSIS — E039 Hypothyroidism, unspecified: Secondary | ICD-10-CM | POA: Diagnosis not present

## 2021-02-03 MED ORDER — LISINOPRIL 40 MG PO TABS: 40.0000 mg | ORAL_TABLET | Freq: Every day | ORAL | 3 refills | Status: DC

## 2021-02-03 MED ORDER — LEVOTHYROXINE SODIUM 50 MCG PO TABS: 50.0000 ug | ORAL_TABLET | Freq: Every day | ORAL | 3 refills | Status: DC

## 2021-02-03 NOTE — Patient Instructions (Signed)

## 2021-02-03 NOTE — Progress Notes (Signed)
Subjective:    Patient ID: Meredith Ruiz, female    DOB: Apr 03, 1946, 75 y.o.   MRN: [id number]   Chief Complaint: Medical Management of Chronic Issues    HPI:  1. Essential hypertension No c/o chest pain, sob or headaches. She does not check her blood pressure at home. BP Readings from Last 3 Encounters:  08/05/20 (!) 146/81  01/30/20 122/71  09/28/19 136/72     2. Acquired hypothyroidism No problems that she is aware of. We increased her synthroid dose to 11mg at last visit. Will repeat labs today. Lab Results  Component Value Date   TSH 5.040 (H) 08/05/2020    3. BMI 30.0-30.9,adult No recent weight changes.  Wt Readings from Last 3 Encounters:  02/03/21 185 lb (83.9 kg)  08/05/20 185 lb (83.9 kg)  01/30/20 184 lb (83.5 kg)   BMI Readings from Last 3 Encounters:  02/03/21 30.79 kg/m  08/05/20 30.79 kg/m  01/30/20 30.62 kg/m      Outpatient Encounter Medications as of 02/03/2021  Medication Sig   aspirin 81 MG tablet Take 81 mg by mouth daily.   diclofenac sodium (VOLTAREN) 1 % GEL Apply 4 g topically 4 (four) times daily. (knee pain). Ok to give OTC version if not covered by insurance   fluticasone (CUTIVATE) 0.05 % cream APPLY  CREAM TOPICALLY TWICE DAILY   ketoconazole (NIZORAL) 2 % cream APPLY TOPICALLY DAILY   levothyroxine (SYNTHROID) 50 MCG tablet Take 1 tablet (50 mcg total) by mouth daily.   lisinopril (ZESTRIL) 40 MG tablet Take 1 tablet (40 mg total) by mouth daily.   No facility-administered encounter medications on file as of 02/03/2021.    Past Surgical History:  Procedure Laterality Date   CESAREAN SECTION      Family History  Problem Relation Age of Onset   Diabetes Mother    Heart attack Father    Heart disease Father    Diabetes Sister    Celiac disease Son    Arthritis Sister     New complaints: None today  Social history: Lives by herself- family check son her daily  Controlled substance contract:  n/a     Review of Systems  Constitutional:  Negative for diaphoresis.  Eyes:  Negative for pain.  Respiratory:  Negative for shortness of breath.   Cardiovascular:  Negative for chest pain, palpitations and leg swelling.  Gastrointestinal:  Negative for abdominal pain.  Endocrine: Negative for polydipsia.  Skin:  Negative for rash.  Neurological:  Negative for dizziness, weakness and headaches.  Hematological:  Does not bruise/bleed easily.  All other systems reviewed and are negative.     Objective:   Physical Exam Vitals and nursing note reviewed.  Constitutional:      General: She is not in acute distress.    Appearance: Normal appearance. She is well-developed.  HENT:     Head: Normocephalic.     Right Ear: Tympanic membrane normal.     Left Ear: Tympanic membrane normal.     Nose: Nose normal.     Mouth/Throat:     Mouth: Mucous membranes are moist.  Eyes:     Pupils: Pupils are equal, round, and reactive to light.  Neck:     Vascular: No carotid bruit or JVD.  Cardiovascular:     Rate and Rhythm: Normal rate and regular rhythm.     Heart sounds: Normal heart sounds.  Pulmonary:     Effort: Pulmonary effort is normal. No respiratory distress.  Breath sounds: Normal breath sounds. No wheezing or rales.  Chest:     Chest wall: No tenderness.  Abdominal:     General: Bowel sounds are normal. There is no distension or abdominal bruit.     Palpations: Abdomen is soft. There is no hepatomegaly, splenomegaly, mass or pulsatile mass.     Tenderness: There is no abdominal tenderness.  Musculoskeletal:        General: Normal range of motion.     Cervical back: Normal range of motion and neck supple.  Lymphadenopathy:     Cervical: No cervical adenopathy.  Skin:    General: Skin is warm and dry.  Neurological:     Mental Status: She is alert and oriented to person, place, and time.     Deep Tendon Reflexes: Reflexes are normal and symmetric.  Psychiatric:         Behavior: Behavior normal.        Thought Content: Thought content normal.        Judgment: Judgment normal.   BP 122/63   Pulse (!) 58   Temp 98 F (36.7 C) (Temporal)   Resp 20   Ht '5\' 5"'  (1.651 m)   Wt 185 lb (83.9 kg)   SpO2 94%   BMI 30.79 kg/m         Assessment & Plan:  Meredith Ruiz comes in today with chief complaint of Medical Management of Chronic Issues   Diagnosis and orders addressed:  1. Essential hypertension Low sodium diet - lisinopril (ZESTRIL) 40 MG tablet; Take 1 tablet (40 mg total) by mouth daily.  Dispense: 90 tablet; Refill: 3 - CBC with Differential/Platelet - CMP14+EGFR - Lipid panel  2. Acquired hypothyroidism Labs pending - Thyroid Panel With TSH - levothyroxine (SYNTHROID) 50 MCG tablet; Take 1 tablet (50 mcg total) by mouth daily.  Dispense: 90 tablet; Refill: 3  3. BMI 30.0-30.9,adult Discussed diet and exercise for person with BMI >25 Will recheck weight in 3-6 months     Labs pending Health Maintenance reviewed- patient refused colonoscopy and cologuard- will schedule mammogram . Diet and exercise encouraged  Follow up plan: 6 month   [place], FNP

## 2021-02-04 LAB — LIPID PANEL
Chol/HDL Ratio: 4 ratio (ref 0.0–4.4)
Cholesterol, Total: 186 mg/dL (ref 100–199)
HDL: 47 mg/dL (ref >39)
LDL Chol Calc (NIH): 127 mg/dL — ABNORMAL HIGH (ref 0–99)
Triglycerides: 65 mg/dL (ref 0–149)
VLDL Cholesterol Cal: 12 mg/dL (ref 5–40)

## 2021-02-04 LAB — CMP14+EGFR
ALT: 13 IU/L (ref 0–32)
AST: 15 IU/L (ref 0–40)
Albumin/Globulin Ratio: 1.6 (ref 1.2–2.2)
Albumin: 4.2 g/dL (ref 3.7–4.7)
Alkaline Phosphatase: 152 IU/L — ABNORMAL HIGH (ref 44–121)
BUN/Creatinine Ratio: 17 (ref 12–28)
BUN: 24 mg/dL (ref 8–27)
Bilirubin Total: 0.4 mg/dL (ref 0.0–1.2)
CO2: 23 mmol/L (ref 20–29)
Calcium: 9.3 mg/dL (ref 8.7–10.3)
Chloride: 104 mmol/L (ref 96–106)
Creatinine, Ser: 1.43 mg/dL — ABNORMAL HIGH (ref 0.57–1.00)
Globulin, Total: 2.7 g/dL (ref 1.5–4.5)
Glucose: 106 mg/dL — ABNORMAL HIGH (ref 65–99)
Potassium: 4.7 mmol/L (ref 3.5–5.2)
Sodium: 140 mmol/L (ref 134–144)
Total Protein: 6.9 g/dL (ref 6.0–8.5)
eGFR: 38 mL/min/{1.73_m2} — ABNORMAL LOW (ref >59)

## 2021-02-04 LAB — CBC WITH DIFFERENTIAL/PLATELET
Basophils Absolute: 0.1 10*3/uL (ref 0.0–0.2)
Basos: 1 %
EOS (ABSOLUTE): 0.1 10*3/uL (ref 0.0–0.4)
Eos: 2 %
Hematocrit: 42.5 % (ref 34.0–46.6)
Hemoglobin: 13.9 g/dL (ref 11.1–15.9)
Immature Grans (Abs): 0 10*3/uL (ref 0.0–0.1)
Immature Granulocytes: 1 %
Lymphocytes Absolute: 2 10*3/uL (ref 0.7–3.1)
Lymphs: 31 %
MCH: 27.8 pg (ref 26.6–33.0)
MCHC: 32.7 g/dL (ref 31.5–35.7)
MCV: 85 fL (ref 79–97)
Monocytes Absolute: 0.5 10*3/uL (ref 0.1–0.9)
Monocytes: 7 %
Neutrophils Absolute: 3.8 10*3/uL (ref 1.4–7.0)
Neutrophils: 58 %
Platelets: 260 10*3/uL (ref 150–450)
RBC: 5 x10E6/uL (ref 3.77–5.28)
RDW: 13.7 % (ref 11.7–15.4)
WBC: 6.4 10*3/uL (ref 3.4–10.8)

## 2021-02-04 LAB — THYROID PANEL WITH TSH
Free Thyroxine Index: 1.9 (ref 1.2–4.9)
T3 Uptake Ratio: 27 % (ref 24–39)
T4, Total: 6.9 ug/dL (ref 4.5–12.0)
TSH: 2.32 u[IU]/mL (ref 0.450–4.500)

## 2021-02-08 ENCOUNTER — Other Ambulatory Visit: Payer: Self-pay | Admitting: Nurse Practitioner

## 2021-02-08 DIAGNOSIS — Z1231 Encounter for screening mammogram for malignant neoplasm of breast: Secondary | ICD-10-CM

## 2021-04-28 ENCOUNTER — Ambulatory Visit
Admission: RE | Admit: 2021-04-28 | Discharge: 2021-04-28 | Disposition: A | Discharge status: Home / Self Care | Payer: Medicare Other | Source: Ambulatory Visit | Attending: Nurse Practitioner | Admitting: Nurse Practitioner

## 2021-04-28 ENCOUNTER — Other Ambulatory Visit: Payer: Self-pay

## 2021-04-28 DIAGNOSIS — Z1231 Encounter for screening mammogram for malignant neoplasm of breast: Secondary | ICD-10-CM | POA: Diagnosis not present

## 2021-06-30 ENCOUNTER — Other Ambulatory Visit: Payer: Self-pay | Admitting: Nurse Practitioner

## 2021-06-30 DIAGNOSIS — E039 Hypothyroidism, unspecified: Secondary | ICD-10-CM

## 2021-07-29 ENCOUNTER — Other Ambulatory Visit: Payer: Self-pay | Admitting: Nurse Practitioner

## 2021-07-29 DIAGNOSIS — B372 Candidiasis of skin and nail: Secondary | ICD-10-CM

## 2021-07-30 ENCOUNTER — Other Ambulatory Visit: Payer: Self-pay | Admitting: Nurse Practitioner

## 2021-07-30 DIAGNOSIS — I10 Essential (primary) hypertension: Secondary | ICD-10-CM

## 2021-08-05 ENCOUNTER — Ambulatory Visit (INDEPENDENT_AMBULATORY_CARE_PROVIDER_SITE_OTHER): Payer: Medicare Other | Admitting: Nurse Practitioner

## 2021-08-05 ENCOUNTER — Encounter: Payer: Self-pay | Admitting: Nurse Practitioner

## 2021-08-05 ENCOUNTER — Other Ambulatory Visit: Payer: Self-pay

## 2021-08-05 VITALS — BP 129/72 | HR 78 | Temp 97.6°F | Resp 20 | Ht 65.0 in | Wt 179.0 lb

## 2021-08-05 DIAGNOSIS — I10 Essential (primary) hypertension: Secondary | ICD-10-CM

## 2021-08-05 DIAGNOSIS — E039 Hypothyroidism, unspecified: Secondary | ICD-10-CM

## 2021-08-05 DIAGNOSIS — Z6832 Body mass index (BMI) 32.0-32.9, adult: Secondary | ICD-10-CM

## 2021-08-05 DIAGNOSIS — H6123 Impacted cerumen, bilateral: Secondary | ICD-10-CM | POA: Diagnosis not present

## 2021-08-05 MED ORDER — LISINOPRIL 40 MG PO TABS: 40.0000 mg | ORAL_TABLET | Freq: Every day | ORAL | 1 refills | Status: DC

## 2021-08-05 MED ORDER — LEVOTHYROXINE SODIUM 50 MCG PO TABS: 50.0000 ug | ORAL_TABLET | Freq: Every day | ORAL | 1 refills | Status: DC

## 2021-08-05 NOTE — Progress Notes (Signed)
Subjective:    Patient ID: Meredith Ruiz, female    DOB: 03/05/1946, 75 y.o.   MRN: [id number]   Chief Complaint: No chief complaint on file.    HPI:  Meredith Ruiz is a 75 y.o. who identifies as a female ,who was assigned female at birth. She lives with her sons and  is retired. She comes in today for follow up of the following chronic issues:  1. Essential hypertension No c/o chest pain, sob or headache. Does  check blood pressure at home. She says it runs good at home but she can not tell me what it is. BP Readings from Last 3 Encounters:  02/03/21 122/63  08/05/20 (!) 146/81  01/30/20 122/71     2. Acquired hypothyroidism No problems that aware of . Lab Results  Component Value Date   TSH 2.320 02/03/2021     3. BMI 32.0-32.9,adult Weight is down 6lbs Wt Readings from Last 3 Encounters:  08/05/21 179 lb (81.2 kg)  02/03/21 185 lb (83.9 kg)  08/05/20 185 lb (83.9 kg)   BMI Readings from Last 3 Encounters:  08/05/21 29.79 kg/m  02/03/21 30.79 kg/m  08/05/20 30.79 kg/m     New complaints: None today   Outpatient Encounter Medications as of 08/05/2021  Medication Sig   aspirin 81 MG tablet Take 81 mg by mouth daily.   diclofenac sodium (VOLTAREN) 1 % GEL Apply 4 g topically 4 (four) times daily. (knee pain). Ok to give OTC version if not covered by insurance   fluticasone (CUTIVATE) 0.05 % cream APPLY  CREAM TOPICALLY TWICE DAILY   ketoconazole (NIZORAL) 2 % cream APPLY 1 CREAM TOPICALLY ONCE DAILY   levothyroxine (SYNTHROID) 50 MCG tablet Take 1 tablet (50 mcg total) by mouth daily.   lisinopril (ZESTRIL) 40 MG tablet Take 1 tablet (40 mg total) by mouth daily.   No facility-administered encounter medications on file as of 08/05/2021.    Past Surgical History:  Procedure Laterality Date   CESAREAN SECTION      Family History  Problem Relation Age of Onset   Diabetes Mother    Heart attack Father    Heart disease Father    Diabetes Sister     Celiac disease Son    Arthritis Sister       Controlled substance contract: n/a     Review of Systems  Constitutional:  Negative for diaphoresis.  Eyes:  Negative for pain.  Respiratory:  Negative for shortness of breath.   Cardiovascular:  Negative for chest pain, palpitations and leg swelling.  Gastrointestinal:  Negative for abdominal pain.  Endocrine: Negative for polydipsia.  Skin:  Negative for rash.  Neurological:  Negative for dizziness, weakness and headaches.  Hematological:  Does not bruise/bleed easily.  All other systems reviewed and are negative.     Objective:   Physical Exam Vitals and nursing note reviewed.  Constitutional:      General: She is not in acute distress.    Appearance: Normal appearance. She is well-developed.  HENT:     Head: Normocephalic.     Right Ear: Tympanic membrane normal. There is impacted cerumen.     Left Ear: Tympanic membrane normal. There is impacted cerumen.     Nose: Nose normal.     Mouth/Throat:     Mouth: Mucous membranes are moist.  Eyes:     Pupils: Pupils are equal, round, and reactive to light.  Neck:     Vascular: No carotid bruit  or JVD.  Cardiovascular:     Rate and Rhythm: Normal rate and regular rhythm.     Heart sounds: Normal heart sounds.  Pulmonary:     Effort: Pulmonary effort is normal. No respiratory distress.     Breath sounds: Normal breath sounds. No wheezing or rales.  Chest:     Chest wall: No tenderness.  Abdominal:     General: Bowel sounds are normal. There is no distension or abdominal bruit.     Palpations: Abdomen is soft. There is no hepatomegaly, splenomegaly, mass or pulsatile mass.     Tenderness: There is no abdominal tenderness.  Musculoskeletal:        General: Normal range of motion.     Cervical back: Normal range of motion and neck supple.  Lymphadenopathy:     Cervical: No cervical adenopathy.  Skin:    General: Skin is warm and dry.  Neurological:     Mental  Status: She is alert and oriented to person, place, and time.     Deep Tendon Reflexes: Reflexes are normal and symmetric.  Psychiatric:        Behavior: Behavior normal.        Thought Content: Thought content normal.        Judgment: Judgment normal.   BP 129/72    Pulse 78    Temp 97.6 F (36.4 C) (Temporal)    Resp 20    Ht '5\' 5"'  (1.651 m)    Wt 179 lb (81.2 kg)    SpO2 98%    BMI 29.79 kg/m   Bil ear lavage       Assessment & Plan:  Meredith Ruiz comes in today with chief complaint of Medical Management of Chronic Issues   Diagnosis and orders addressed:  1. Essential hypertension Low sodium diet - lisinopril (ZESTRIL) 40 MG tablet; Take 1 tablet (40 mg total) by mouth daily.  Dispense: 90 tablet; Refill: 1 - CBC with Differential/Platelet - CMP14+EGFR - Lipid panel  2. Acquired hypothyroidism Labs pending - levothyroxine (SYNTHROID) 50 MCG tablet; Take 1 tablet (50 mcg total) by mouth daily.  Dispense: 90 tablet; Refill: 1 - Thyroid Panel With TSH  3. BMI 32.0-32.9,adult Discussed diet and exercise for person with BMI >25 Will recheck weight in 3-6 months   4. Bilateral impacted cerumen Debrox ear wax removal 2-3 x a week   Labs pending Health Maintenance reviewed Diet and exercise encouraged  Follow up plan: 6 months   Mary-Margaret Hassell Done, FNP

## 2021-08-05 NOTE — Patient Instructions (Signed)
Earwax Buildup, Adult ?The ears produce a substance called earwax that helps keep bacteria out of the ear and protects the skin in the ear canal. Occasionally, earwax can build up in the ear and cause discomfort or hearing loss. ?What are the causes? ?This condition is caused by a buildup of earwax. Ear canals are self-cleaning. Ear wax is made in the outer part of the ear canal and generally falls out in small amounts over time. ?When the self-cleaning mechanism is not working, earwax builds up and can cause decreased hearing and discomfort. Attempting to clean ears with cotton swabs can push the earwax deep into the ear canal and cause decreased hearing and pain. ?What increases the risk? ?This condition is more likely to develop in people who: ?Clean their ears often with cotton swabs. ?Pick at their ears. ?Use earplugs or in-ear headphones often, or wear hearing aids. ?The following factors may also make you more likely to develop this condition: ?Being female. ?Being of older age. ?Naturally producing more earwax. ?Having narrow ear canals. ?Having earwax that is overly thick or sticky. ?Having excess hair in the ear canal. ?Having eczema. ?Being dehydrated. ?What are the signs or symptoms? ?Symptoms of this condition include: ?Reduced or muffled hearing. ?A feeling of fullness in the ear or feeling that the ear is plugged. ?Fluid coming from the ear. ?Ear pain or an itchy ear. ?Ringing in the ear. ?Coughing. ?Balance problems. ?An obvious piece of earwax that can be seen inside the ear canal. ?How is this diagnosed? ?This condition may be diagnosed based on: ?Your symptoms. ?Your medical history. ?An ear exam. During the exam, your health care provider will look into your ear with an instrument called an otoscope. ?You may have tests, including a hearing test. ?How is this treated? ?This condition may be treated by: ?Using ear drops to soften the earwax. ?Having the earwax removed by a health care provider. The  health care provider may: ?Flush the ear with water. ?Use an instrument that has a loop on the end (curette). ?Use a suction device. ?Having surgery to remove the wax buildup. This may be done in severe cases. ?Follow these instructions at home: ? ?Take over-the-counter and prescription medicines only as told by your health care provider. ?Do not put any objects, including cotton swabs, into your ear. You can clean the opening of your ear canal with a washcloth or facial tissue. ?Follow instructions from your health care provider about cleaning your ears. Do not overclean your ears. ?Drink enough fluid to keep your urine pale yellow. This will help to thin the earwax. ?Keep all follow-up visits as told. If earwax builds up in your ears often or if you use hearing aids, consider seeing your health care provider for routine, preventive ear cleanings. Ask your health care provider how often you should schedule your cleanings. ?If you have hearing aids, clean them according to instructions from the manufacturer and your health care provider. ?Contact a health care provider if: ?You have ear pain. ?You develop a fever. ?You have pus or other fluid coming from your ear. ?You have hearing loss. ?You have ringing in your ears that does not go away. ?You feel like the room is spinning (vertigo). ?Your symptoms do not improve with treatment. ?Get help right away if: ?You have bleeding from the affected ear. ?You have severe ear pain. ?Summary ?Earwax can build up in the ear and cause discomfort or hearing loss. ?The most common symptoms of this condition include   reduced or muffled hearing, a feeling of fullness in the ear, or feeling that the ear is plugged. ?This condition may be diagnosed based on your symptoms, your medical history, and an ear exam. ?This condition may be treated by using ear drops to soften the earwax or by having the earwax removed by a health care provider. ?Do not put any objects, including cotton  swabs, into your ear. You can clean the opening of your ear canal with a washcloth or facial tissue. ?This information is not intended to replace advice given to you by your health care provider. Make sure you discuss any questions you have with your health care provider. ?Document Revised: 11/21/2019 Document Reviewed: 11/21/2019 ?Elsevier Patient Education ? 2022 Elsevier Inc[employer] ? ?

## 2021-08-06 LAB — CMP14+EGFR
ALT: 9 IU/L (ref 0–32)
AST: 14 IU/L (ref 0–40)
Albumin/Globulin Ratio: 1.5 (ref 1.2–2.2)
Albumin: 4.2 g/dL (ref 3.7–4.7)
Alkaline Phosphatase: 149 IU/L — ABNORMAL HIGH (ref 44–121)
BUN/Creatinine Ratio: 18 (ref 12–28)
BUN: 18 mg/dL (ref 8–27)
Bilirubin Total: 0.4 mg/dL (ref 0.0–1.2)
CO2: 27 mmol/L (ref 20–29)
Calcium: 9.4 mg/dL (ref 8.7–10.3)
Chloride: 103 mmol/L (ref 96–106)
Creatinine, Ser: 1.02 mg/dL — ABNORMAL HIGH (ref 0.57–1.00)
Globulin, Total: 2.8 g/dL (ref 1.5–4.5)
Glucose: 106 mg/dL — ABNORMAL HIGH (ref 70–99)
Potassium: 4.3 mmol/L (ref 3.5–5.2)
Sodium: 140 mmol/L (ref 134–144)
Total Protein: 7 g/dL (ref 6.0–8.5)
eGFR: 58 mL/min/{1.73_m2} — ABNORMAL LOW (ref >59)

## 2021-08-06 LAB — CBC WITH DIFFERENTIAL/PLATELET
Basophils Absolute: 0 10*3/uL (ref 0.0–0.2)
Basos: 1 %
EOS (ABSOLUTE): 0.1 10*3/uL (ref 0.0–0.4)
Eos: 2 %
Hematocrit: 44.3 % (ref 34.0–46.6)
Hemoglobin: 14 g/dL (ref 11.1–15.9)
Immature Grans (Abs): 0 10*3/uL (ref 0.0–0.1)
Immature Granulocytes: 0 %
Lymphocytes Absolute: 1.8 10*3/uL (ref 0.7–3.1)
Lymphs: 25 %
MCH: 27.3 pg (ref 26.6–33.0)
MCHC: 31.6 g/dL (ref 31.5–35.7)
MCV: 86 fL (ref 79–97)
Monocytes Absolute: 0.5 10*3/uL (ref 0.1–0.9)
Monocytes: 7 %
Neutrophils Absolute: 4.6 10*3/uL (ref 1.4–7.0)
Neutrophils: 65 %
Platelets: 265 10*3/uL (ref 150–450)
RBC: 5.13 x10E6/uL (ref 3.77–5.28)
RDW: 13.7 % (ref 11.7–15.4)
WBC: 7 10*3/uL (ref 3.4–10.8)

## 2021-08-06 LAB — THYROID PANEL WITH TSH
Free Thyroxine Index: 2.2 (ref 1.2–4.9)
T3 Uptake Ratio: 27 % (ref 24–39)
T4, Total: 8.3 ug/dL (ref 4.5–12.0)
TSH: 2.74 u[IU]/mL (ref 0.450–4.500)

## 2021-08-06 LAB — LIPID PANEL
Chol/HDL Ratio: 4 ratio (ref 0.0–4.4)
Cholesterol, Total: 184 mg/dL (ref 100–199)
HDL: 46 mg/dL (ref >39)
LDL Chol Calc (NIH): 126 mg/dL — ABNORMAL HIGH (ref 0–99)
Triglycerides: 63 mg/dL (ref 0–149)
VLDL Cholesterol Cal: 12 mg/dL (ref 5–40)

## 2021-10-27 ENCOUNTER — Ambulatory Visit (INDEPENDENT_AMBULATORY_CARE_PROVIDER_SITE_OTHER): Payer: Medicare Other

## 2021-10-27 VITALS — Wt 185.0 lb

## 2021-10-27 DIAGNOSIS — Z0000 Encounter for general adult medical examination without abnormal findings: Secondary | ICD-10-CM | POA: Diagnosis not present

## 2021-10-27 NOTE — Progress Notes (Signed)
Subjective:   Meredith Ruiz is a 76 y.o. female who presents for Medicare Annual (Subsequent) preventive examination.  Virtual Visit via Telephone Note  I connected with  Meredith Ruiz on 10/27/21 at  9:45 AM EDT by telephone and verified that I am speaking with the correct person using two identifiers.  Location: Patient: Home Provider: [facility] Persons participating in the virtual visit: patient/Nurse Health Advisor   I discussed the limitations, risks, security and privacy concerns of performing an evaluation and management service by telephone and the availability of in person appointments. The patient expressed understanding and agreed to proceed.  Interactive audio and video telecommunications were attempted between this nurse and patient, however failed, due to patient having technical difficulties OR patient did not have access to video capability.  We continued and completed visit with audio only.  Some vital signs may be absent or patient reported.   Tene Gato E Janicia Monterrosa, LPN   Review of Systems     Cardiac Risk Factors include: advanced age (>3955men, 66>65 women);obesity (BMI >30kg/m2);sedentary lifestyle;hypertension     Objective:    Today's Vitals   10/27/21 0948  Weight: 185 lb (83.9 kg)   Body mass index is 30.79 kg/m.  Advanced Directives 10/27/2021 10/24/2020 10/17/2019 09/03/2016  Does Patient Have a Medical Advance Directive? No No No No  Would patient like information on creating a medical advance directive? No - Patient declined No - Patient declined No - Patient declined No - Patient declined    Current Medications (verified) Outpatient Encounter Medications as of 10/27/2021  Medication Sig   aspirin 81 MG tablet Take 81 mg by mouth daily.   diclofenac sodium (VOLTAREN) 1 % GEL Apply 4 g topically 4 (four) times daily. (knee pain). Ok to give OTC version if not covered by insurance   fluticasone (CUTIVATE) 0.05 % cream APPLY  CREAM TOPICALLY TWICE DAILY    ketoconazole (NIZORAL) 2 % cream APPLY 1 CREAM TOPICALLY ONCE DAILY   levothyroxine (SYNTHROID) 50 MCG tablet Take 1 tablet (50 mcg total) by mouth daily.   lisinopril (ZESTRIL) 40 MG tablet Take 1 tablet (40 mg total) by mouth daily.   No facility-administered encounter medications on file as of 10/27/2021.    Allergies (verified) Sulfa antibiotics   History: Past Medical History:  Diagnosis Date   Hypertension    Thyroid disease    Past Surgical History:  Procedure Laterality Date   CESAREAN SECTION     Family History  Problem Relation Age of Onset   Diabetes Mother    Heart attack Father    Heart disease Father    Diabetes Sister    Celiac disease Son    Arthritis Sister    Social History   Socioeconomic History   Marital status: Legally Separated    Spouse name: Not on file   Number of children: 2   Years of education: 9   Highest education level: 9th grade  Occupational History   Occupation: Retired  Tobacco Use   Smoking status: Former    Packs/day: 0.25    Years: 0.50    Pack years: 0.13    Types: Cigarettes    Quit date: 11/21/1973    Years since quitting: 47.9   Smokeless tobacco: Never  Vaping Use   Vaping Use: Never used  Substance and Sexual Activity   Alcohol use: No   Drug use: Never   Sexual activity: Not Currently  Other Topics Concern   Not on file  Social  History Narrative   2 sons live with her   Social Determinants of Corporate investment banker Strain: Low Risk    Difficulty of Paying Living Expenses: Not hard at all  Food Insecurity: No Food Insecurity   Worried About Programme researcher, broadcasting/film/video in the Last Year: Never true   Barista in the Last Year: Never true  Transportation Needs: No Transportation Needs   Lack of Transportation (Medical): No   Lack of Transportation (Non-Medical): No  Physical Activity: Insufficiently Active   Days of Exercise per Week: 4 days   Minutes of Exercise per Session: 30 min  Stress: No Stress  Concern Present   Feeling of Stress : Not at all  Social Connections: Socially Isolated   Frequency of Communication with Friends and Family: More than three times a week   Frequency of Social Gatherings with Friends and Family: Twice a week   Attends Religious Services: Never   Database administrator or Organizations: No   Attends Engineer, structural: Never   Marital Status: Separated    Tobacco Counseling Counseling given: Not Answered   Clinical Intake:  Pre-visit preparation completed: Yes  Pain : No/denies pain     BMI - recorded: 30.79 Nutritional Status: BMI > 30  Obese Nutritional Risks: None Diabetes: No  How often do you need to have someone help you when you read instructions, pamphlets, or other written materials from your doctor or pharmacy?: 1 - Never  Diabetic? no  Interpreter Needed?: No  Information entered by :: Vernelle Wisner, LPN   Activities of Daily Living In your present state of health, do you have any difficulty performing the following activities: 10/27/2021  Hearing? N  Vision? N  Difficulty concentrating or making decisions? N  Walking or climbing stairs? N  Dressing or bathing? N  Doing errands, shopping? N  Preparing Food and eating ? N  Using the Toilet? N  In the past six months, have you accidently leaked urine? N  Do you have problems with loss of bowel control? N  Managing your Medications? N  Managing your Finances? N  Housekeeping or managing your Housekeeping? N  Some recent data might be hidden    Patient Care Team: Bennie Pierini, FNP as PCP - General (Nurse Practitioner)  Indicate any recent Medical Services you may have received from other than [facility] providers in the past year (date may be approximate).     Assessment:   This is a routine wellness examination for Meredith Ruiz.  Hearing/Vision screen Hearing Screening - Comments:: Denies hearing difficulties   Vision Screening - Comments:: Wears rx  glasses - no eye doctor at this time - behind on annual eye exams  Dietary issues and exercise activities discussed: Current Exercise Habits: Home exercise routine, Type of exercise: stretching;walking, Time (Minutes): 30, Frequency (Times/Week): 4, Weekly Exercise (Minutes/Week): 120, Intensity: Mild, Exercise limited by: None identified   Goals Addressed             This Visit's Progress    Patient Stated   On track    10/17/2019 AWV Goal: Exercise for General Health  Patient will verbalize understanding of the benefits of increased physical activity: Exercising regularly is important. It will improve your overall fitness, flexibility, and endurance. Regular exercise also will improve your overall health. It can help you control your weight, reduce stress, and improve your bone density. Over the next year, patient will increase physical activity as tolerated with a  goal of at least 150 minutes of moderate physical activity per week.  You can tell that you are exercising at a moderate intensity if your heart starts beating faster and you start breathing faster but can still hold a conversation. Moderate-intensity exercise ideas include: Walking 1 mile (1.6 km) in about 15 minutes Biking Hiking Golfing Dancing Water aerobics Patient will verbalize understanding of everyday activities that increase physical activity by providing examples like the following: Yard work, such as: Insurance underwriter Gardening Washing windows or floors Patient will be able to explain general safety guidelines for exercising:  Before you start a new exercise program, talk with your health care provider. Do not exercise so much that you hurt yourself, feel dizzy, or get very short of breath. Wear comfortable clothes and wear shoes with good support. Drink plenty of water while you exercise to prevent dehydration or heat  stroke. Work out until your breathing and your heartbeat get faster.         Depression Screen PHQ 2/9 Scores 10/27/2021 08/05/2021 02/03/2021 10/24/2020 08/05/2020 01/30/2020 10/17/2019  PHQ - 2 Score 0 0 3 0 0 0 0  PHQ- 9 Score - 0 3 - - - -    Fall Risk Fall Risk  10/27/2021 08/05/2021 02/03/2021 10/24/2020 08/05/2020  Falls in the past year? 0 0 0 0 0  Number falls in past yr: 0 - - - -  Injury with Fall? 0 - - - -  Comment - - - - -  Risk for fall due to : No Fall Risks - - - -  Follow up Falls prevention discussed - - Falls evaluation completed -    FALL RISK PREVENTION PERTAINING TO THE HOME:  Any stairs in or around the home? Yes  If so, are there any without handrails? No  Home free of loose throw rugs in walkways, pet beds, electrical cords, etc? Yes  Adequate lighting in your home to reduce risk of falls? Yes   ASSISTIVE DEVICES UTILIZED TO PREVENT FALLS:  Life alert? No  Use of a cane, walker or w/c? No  Grab bars in the bathroom? No  Shower chair or bench in shower? No  Elevated toilet seat or a handicapped toilet? Yes   TIMED UP AND GO:  Was the test performed? No . Telephonic visit.  Cognitive Function: Normal cognitive status assessed by direct observation by this Nurse Health Advisor. No abnormalities found.       6CIT Screen 10/24/2020 10/17/2019  What Year? 0 points 0 points  What month? 0 points 0 points  What time? 0 points 0 points  Count back from 20 0 points 0 points  Months in reverse 0 points 0 points  Repeat phrase 0 points 2 points  Total Score 0 2    Immunizations Immunization History  Administered Date(s) Administered   PFIZER(Purple Top)SARS-COV-2 Vaccination 10/13/2019, 11/10/2019, 06/21/2020, 12/09/2020   Tdap 07/13/2016    TDAP status: Up to date  Flu Vaccine status: Declined, Education has been provided regarding the importance of this vaccine but patient still declined. Advised may receive this vaccine at local pharmacy or Health  Dept. Aware to provide a copy of the vaccination record if obtained from local pharmacy or Health Dept. Verbalized acceptance and understanding.  Pneumococcal vaccine status: Declined,  Education has been provided regarding the importance of this vaccine but patient still declined. Advised may receive this vaccine at local  pharmacy or Health Dept. Aware to provide a copy of the vaccination record if obtained from local pharmacy or Health Dept. Verbalized acceptance and understanding.   Covid-19 vaccine status: Completed vaccines  Qualifies for Shingles Vaccine? Yes   Zostavax completed No   Shingrix Completed?: No.    Education has been provided regarding the importance of this vaccine. Patient has been advised to call insurance company to determine out of pocket expense if they have not yet received this vaccine. Advised may also receive vaccine at local pharmacy or Health Dept. Verbalized acceptance and understanding.  Screening Tests Health Maintenance  Topic Date Due   COVID-19 Vaccine (5 - Booster for Pfizer series) 02/03/2021   Zoster Vaccines- Shingrix (1 of 2) 11/03/2021 (Originally 12/21/1996)   INFLUENZA VACCINE  11/14/2021 (Originally 03/17/2021)   DEXA SCAN  02/03/2022 (Originally 12/22/2011)   Fecal DNA (Cologuard)  02/03/2022 (Originally 12/22/1991)   Pneumonia Vaccine 33+ Years old (1 - PCV) 08/05/2022 (Originally 12/22/2011)   MAMMOGRAM  04/28/2022   TETANUS/TDAP  07/13/2026   Hepatitis C Screening  Completed   HPV VACCINES  Aged Out    Health Maintenance  Health Maintenance Due  Topic Date Due   COVID-19 Vaccine (5 - Booster for Pfizer series) 02/03/2021    Declines Colonoscopy  Mammogram status: Completed 04/28/2021. Repeat every year  Declines DEXA  Lung Cancer Screening: (Low Dose CT Chest recommended if Age 17-80 years, 30 pack-year currently smoking OR have quit w/in 15years.) does not qualify.   Additional Screening:  Hepatitis C Screening: does qualify;  Completed 01/09/2016  Vision Screening: Recommended annual ophthalmology exams for early detection of glaucoma and other disorders of the eye. Is the patient up to date with their annual eye exam?  No  Who is the provider or what is the name of the office in which the patient attends annual eye exams? none If pt is not established with a provider, would they like to be referred to a provider to establish care? No .   Dental Screening: Recommended annual dental exams for proper oral hygiene  Community Resource Referral / Chronic Care Management: CRR required this visit?  No   CCM required this visit?  No      Plan:     I have personally reviewed and noted the following in the patients chart:   Medical and social history Use of alcohol, tobacco or illicit drugs  Current medications and supplements including opioid prescriptions.  Functional ability and status Nutritional status Physical activity Advanced directives List of other physicians Hospitalizations, surgeries, and ER visits in previous 12 months Vitals Screenings to include cognitive, depression, and falls Referrals and appointments  In addition, I have reviewed and discussed with patient certain preventive protocols, quality metrics, and best practice recommendations. A written personalized care plan for preventive services as well as general preventive health recommendations were provided to patient.     [place] Constable, LPN   1/61/0960   Nurse Notes: None

## 2021-10-27 NOTE — Patient Instructions (Signed)
[name] , Thank you for taking time to come for your Medicare Wellness Visit. I appreciate your ongoing commitment to your health goals. Please review the following plan we discussed and let me know if I can assist you in the future.   Screening recommendations/referrals: Colonoscopy: Declined - consider Cologuard - it is an at home, non-invasive test Mammogram: Done 04/28/2021 - Repeat annually Bone Density: Declines - we can do this every 2 years at our office - it is non-invasive and checks how strong your bones are Recommended yearly ophthalmology/optometry visit for glaucoma screening and checkup Recommended yearly dental visit for hygiene and checkup  Vaccinations: Influenza vaccine: Declined - recommended annually Pneumococcal vaccine: Declines - Ask about once per lifetime  Prevnar-20 Tdap vaccine: Done 07/13/2016 - Repeat in 10 years Shingles vaccine: Declined - Shingrix is 2 doses 2-6 months apart and over 90% effective  Covid-19: Done 10/13/19, 11/10/19, 06/21/20, 12/09/20  Advanced directives: Advance directive discussed with you today. Even though you declined this today, please call our office should you change your mind, and we can give you the proper paperwork for you to fill out.   Conditions/risks identified: Aim for 30 minutes of exercise or brisk walking, 6-8 glasses of water, and 5 servings of fruits and vegetables each day.   Next appointment: Follow up in one year for your annual wellness visit    Preventive Care 75 Years and Older, Female Preventive care refers to lifestyle choices and visits with your health care provider that can promote health and wellness. What does preventive care include? A yearly physical exam. This is also called an annual well check. Dental exams once or twice a year. Routine eye exams. Ask your health care provider how often you should have your eyes checked. Personal lifestyle choices, including: Daily care of your teeth and  gums. Regular physical activity. Eating a healthy diet. Avoiding tobacco and drug use. Limiting alcohol use. Practicing safe sex. Taking low-dose aspirin every day. Taking vitamin and mineral supplements as recommended by your health care provider. What happens during an annual well check? The services and screenings done by your health care provider during your annual well check will depend on your age, overall health, lifestyle risk factors, and family history of disease. Counseling  Your health care provider may ask you questions about your: Alcohol use. Tobacco use. Drug use. Emotional well-being. Home and relationship well-being. Sexual activity. Eating habits. History of falls. Memory and ability to understand (cognition). Work and work Astronomer. Reproductive health. Screening  You may have the following tests or measurements: Height, weight, and BMI. Blood pressure. Lipid and cholesterol levels. These may be checked every 5 years, or more frequently if you are over 55 years old. Skin check. Lung cancer screening. You may have this screening every year starting at age 75 if you have a 30-pack-year history of smoking and currently smoke or have quit within the past 15 years. Fecal occult blood test (FOBT) of the stool. You may have this test every year starting at age 75. Flexible sigmoidoscopy or colonoscopy. You may have a sigmoidoscopy every 5 years or a colonoscopy every 10 years starting at age 75. Hepatitis C blood test. Hepatitis B blood test. Sexually transmitted disease (STD) testing. Diabetes screening. This is done by checking your blood sugar (glucose) after you have not eaten for a while (fasting). You may have this done every 1-3 years. Bone density scan. This is done to screen for osteoporosis. You may have this done  starting at age 75. Mammogram. This may be done every 1-2 years. Talk to your health care provider about how often you should have regular  mammograms. Talk with your health care provider about your test results, treatment options, and if necessary, the need for more tests. Vaccines  Your health care provider may recommend certain vaccines, such as: Influenza vaccine. This is recommended every year. Tetanus, diphtheria, and acellular pertussis (Tdap, Td) vaccine. You may need a Td booster every 10 years. Zoster vaccine. You may need this after age 75. Pneumococcal 13-valent conjugate (PCV13) vaccine. One dose is recommended after age 75. Pneumococcal polysaccharide (PPSV23) vaccine. One dose is recommended after age 75. Talk to your health care provider about which screenings and vaccines you need and how often you need them. This information is not intended to replace advice given to you by your health care provider. Make sure you discuss any questions you have with your health care provider. Document Released: 08/30/2015 Document Revised: 04/22/2016 Document Reviewed: 06/04/2015 Elsevier Interactive Patient Education  2017 [employer]  Fall Prevention in the Home Falls can cause injuries. They can happen to people of all ages. There are many things you can do to make your home safe and to help prevent falls. What can I do on the outside of my home? Regularly fix the edges of walkways and driveways and fix any cracks. Remove anything that might make you trip as you walk through a door, such as a raised step or threshold. Trim any bushes or trees on the path to your home. Use bright outdoor lighting. Clear any walking paths of anything that might make someone trip, such as rocks or tools. Regularly check to see if handrails are loose or broken. Make sure that both sides of any steps have handrails. Any raised decks and porches should have guardrails on the edges. Have any leaves, snow, or ice cleared regularly. Use sand or salt on walking paths during winter. Clean up any spills in your garage right away. This includes oil  or grease spills. What can I do in the bathroom? Use night lights. Install grab bars by the toilet and in the tub and shower. Do not use towel bars as grab bars. Use non-skid mats or decals in the tub or shower. If you need to sit down in the shower, use a plastic, non-slip stool. Keep the floor dry. Clean up any water that spills on the floor as soon as it happens. Remove soap buildup in the tub or shower regularly. Attach bath mats securely with double-sided non-slip rug tape. Do not have throw rugs and other things on the floor that can make you trip. What can I do in the bedroom? Use night lights. Make sure that you have a light by your bed that is easy to reach. Do not use any sheets or blankets that are too big for your bed. They should not hang down onto the floor. Have a firm chair that has side arms. You can use this for support while you get dressed. Do not have throw rugs and other things on the floor that can make you trip. What can I do in the kitchen? Clean up any spills right away. Avoid walking on wet floors. Keep items that you use a lot in easy-to-reach places. If you need to reach something above you, use a strong step stool that has a grab bar. Keep electrical cords out of the way. Do not use floor polish or wax that  makes floors slippery. If you must use wax, use non-skid floor wax. Do not have throw rugs and other things on the floor that can make you trip. What can I do with my stairs? Do not leave any items on the stairs. Make sure that there are handrails on both sides of the stairs and use them. Fix handrails that are broken or loose. Make sure that handrails are as long as the stairways. Check any carpeting to make sure that it is firmly attached to the stairs. Fix any carpet that is loose or worn. Avoid having throw rugs at the top or bottom of the stairs. If you do have throw rugs, attach them to the floor with carpet tape. Make sure that you have a light  switch at the top of the stairs and the bottom of the stairs. If you do not have them, ask someone to add them for you. What else can I do to help prevent falls? Wear shoes that: Do not have high heels. Have rubber bottoms. Are comfortable and fit you well. Are closed at the toe. Do not wear sandals. If you use a stepladder: Make sure that it is fully opened. Do not climb a closed stepladder. Make sure that both sides of the stepladder are locked into place. Ask someone to hold it for you, if possible. Clearly mark and make sure that you can see: Any grab bars or handrails. First and last steps. Where the edge of each step is. Use tools that help you move around (mobility aids) if they are needed. These include: Canes. Walkers. Scooters. Crutches. Turn on the lights when you go into a dark area. Replace any light bulbs as soon as they burn out. Set up your furniture so you have a clear path. Avoid moving your furniture around. If any of your floors are uneven, fix them. If there are any pets around you, be aware of where they are. Review your medicines with your doctor. Some medicines can make you feel dizzy. This can increase your chance of falling. Ask your doctor what other things that you can do to help prevent falls. This information is not intended to replace advice given to you by your health care provider. Make sure you discuss any questions you have with your health care provider. Document Released: 05/30/2009 Document Revised: 01/09/2016 Document Reviewed: 09/07/2014 Elsevier Interactive Patient Education  2017 [employer].

## 2022-01-08 ENCOUNTER — Other Ambulatory Visit: Payer: Self-pay | Admitting: Nurse Practitioner

## 2022-01-08 DIAGNOSIS — B372 Candidiasis of skin and nail: Secondary | ICD-10-CM

## 2022-01-19 ENCOUNTER — Encounter (HOSPITAL_COMMUNITY): Payer: Self-pay | Admitting: *Deleted

## 2022-01-19 ENCOUNTER — Other Ambulatory Visit: Payer: Self-pay

## 2022-01-19 ENCOUNTER — Emergency Department (HOSPITAL_COMMUNITY): Payer: Medicare HMO

## 2022-01-19 ENCOUNTER — Emergency Department (HOSPITAL_COMMUNITY)
Admission: EM | Admit: 2022-01-19 | Discharge: 2022-01-19 | Disposition: A | Discharge status: Home / Self Care | Payer: Medicare HMO | Attending: Emergency Medicine | Admitting: Emergency Medicine

## 2022-01-19 DIAGNOSIS — W19XXXA Unspecified fall, initial encounter: Secondary | ICD-10-CM

## 2022-01-19 DIAGNOSIS — Z79899 Other long term (current) drug therapy: Secondary | ICD-10-CM | POA: Insufficient documentation

## 2022-01-19 DIAGNOSIS — S52611A Displaced fracture of right ulna styloid process, initial encounter for closed fracture: Secondary | ICD-10-CM | POA: Insufficient documentation

## 2022-01-19 DIAGNOSIS — Z7982 Long term (current) use of aspirin: Secondary | ICD-10-CM | POA: Diagnosis not present

## 2022-01-19 DIAGNOSIS — M7989 Other specified soft tissue disorders: Secondary | ICD-10-CM | POA: Diagnosis not present

## 2022-01-19 DIAGNOSIS — W010XXA Fall on same level from slipping, tripping and stumbling without subsequent striking against object, initial encounter: Secondary | ICD-10-CM | POA: Insufficient documentation

## 2022-01-19 DIAGNOSIS — S52601A Unspecified fracture of lower end of right ulna, initial encounter for closed fracture: Secondary | ICD-10-CM | POA: Diagnosis not present

## 2022-01-19 DIAGNOSIS — S6991XA Unspecified injury of right wrist, hand and finger(s), initial encounter: Secondary | ICD-10-CM | POA: Diagnosis present

## 2022-01-19 DIAGNOSIS — S52501A Unspecified fracture of the lower end of right radius, initial encounter for closed fracture: Secondary | ICD-10-CM | POA: Diagnosis not present

## 2022-01-19 DIAGNOSIS — M25531 Pain in right wrist: Secondary | ICD-10-CM | POA: Diagnosis not present

## 2022-01-19 DIAGNOSIS — S52591A Other fractures of lower end of right radius, initial encounter for closed fracture: Secondary | ICD-10-CM | POA: Insufficient documentation

## 2022-01-19 DIAGNOSIS — M189 Osteoarthritis of first carpometacarpal joint, unspecified: Secondary | ICD-10-CM | POA: Diagnosis not present

## 2022-01-19 MED ORDER — OXYCODONE-ACETAMINOPHEN 5-325 MG PO TABS
1.0000 | ORAL_TABLET | Freq: Four times a day (QID) | ORAL | 0 refills | Status: DC | PRN
Start: 2022-01-19 — End: 2022-08-26

## 2022-01-19 NOTE — Discharge Instructions (Signed)
Keep your arm elevated.  And follow-up with Dr. Romeo Apple later this week

## 2022-01-19 NOTE — ED Triage Notes (Signed)
Pt tripped and fell today. Swelling and deformity noted to right wrist.  Denies hitting her head or blood thinners.

## 2022-01-19 NOTE — ED Notes (Signed)
Went over dc papers. Ambulatory to lobby . Verbalized understanding.  

## 2022-01-19 NOTE — ED Provider Notes (Signed)
[facility] EMERGENCY DEPARTMENT Provider Note   CSN: [id number] Arrival date & time: 01/19/22  2034     History {Add pertinent medical, surgical, social history, OB history to HPI:1} Chief Complaint  Patient presents with   Meredith Ruiz is a 76 y.o. female.  Patient fell and injured her right wrist.   Fall      Home Medications Prior to Admission medications   Medication Sig Start Date End Date Taking? Authorizing Provider  oxyCODONE-acetaminophen (PERCOCET/ROXICET) 5-325 MG tablet Take 1 tablet by mouth every 6 (six) hours as needed for severe pain. 01/19/22  Yes Milton Ferguson, MD  aspirin 81 MG tablet Take 81 mg by mouth daily.    [provider]  diclofenac sodium (VOLTAREN) 1 % GEL Apply 4 g topically 4 (four) times daily. (knee pain). Ok to give OTC version if not covered by insurance 05/03/19   Ronnie Doss M, DO  fluticasone (CUTIVATE) 0.05 % cream APPLY CREAM TOPICALLY TWICE DAILY 01/09/22   Hassell Done, Mary-Margaret, FNP  ketoconazole (NIZORAL) 2 % cream APPLY CREAM TOPICALLY ONCE DAILY 01/09/22   Hassell Done, Mary-Margaret, FNP  levothyroxine (SYNTHROID) 50 MCG tablet Take 1 tablet (50 mcg total) by mouth daily. 08/05/21   Hassell Done, Mary-Margaret, FNP  lisinopril (ZESTRIL) 40 MG tablet Take 1 tablet (40 mg total) by mouth daily. 08/05/21   Chevis Pretty, FNP      Allergies    Sulfa antibiotics    Review of Systems   Review of Systems  Physical Exam Updated Vital Signs BP (!) 161/73 (BP Location: Left Arm)   Pulse 85   Temp 97.7 F (36.5 C) (Oral)   Resp 16   Ht 5\' 5"  (1.651 m)   Wt 86.2 kg   SpO2 95%   BMI 31.62 kg/m  Physical Exam  ED Results / Procedures / Treatments   Labs (all labs ordered are listed, but only abnormal results are displayed) Labs Reviewed - No data to display  EKG None  Radiology DG Wrist Complete Right  Result Date: 01/19/2022 CLINICAL DATA:  Recent trip and fall with wrist pain, initial encounter  EXAM: RIGHT WRIST - COMPLETE 3+ VIEW COMPARISON:  None Available. FINDINGS: Comminuted distal radial fracture is noted without articular involvement. Impaction and posterior angulation are noted at the fracture site. The distal ulna shows an impaction fracture in the metaphysis. Ulnar styloid fracture is noted as well. Degenerative changes at the first [facility] joint are noted. Soft tissue swelling about the wrist is seen. IMPRESSION: Distal radial and ulnar fractures with mild soft tissue swelling. Degenerative changes of the first [facility] joint. Electronically Signed   By: Inez Catalina M.D.   On: 01/19/2022 21:42    Procedures Procedures  {Document cardiac monitor, telemetry assessment procedure when appropriate:1}  Medications Ordered in ED Medications - No data to display  ED Course/ Medical Decision Making/ A&P                           Medical Decision Making Amount and/or Complexity of Data Reviewed Radiology: ordered.  Risk Prescription drug management.   Patient has a closed fracture of distal right ulnar and radius.  She will be placed in a sugar-tong splint and will follow-up with orthopedic  {Document critical care time when appropriate:1} {Document review of labs and clinical decision tools ie heart score, Chads2Vasc2 etc:1}  {Document your independent review of radiology images, and any outside records:1} {Document your discussion  with family members, caretakers, and with consultants:1} {Document social determinants of health affecting pt's care:1} {Document your decision making why or why not admission, treatments were needed:1} Final Clinical Impression(s) / ED Diagnoses Final diagnoses:  Fall, initial encounter    Rx / DC Orders ED Discharge Orders          Ordered    oxyCODONE-acetaminophen (PERCOCET/ROXICET) 5-325 MG tablet  Every 6 hours PRN        01/19/22 2257

## 2022-01-20 ENCOUNTER — Encounter: Payer: Self-pay | Admitting: Orthopedic Surgery

## 2022-01-20 ENCOUNTER — Ambulatory Visit: Payer: Medicare HMO | Admitting: Orthopedic Surgery

## 2022-01-20 VITALS — BP 127/69 | HR 81 | Ht 65.0 in | Wt 190.0 lb

## 2022-01-20 DIAGNOSIS — S52539A Colles' fracture of unspecified radius, initial encounter for closed fracture: Secondary | ICD-10-CM

## 2022-01-20 DIAGNOSIS — S52531A Colles' fracture of right radius, initial encounter for closed fracture: Secondary | ICD-10-CM | POA: Diagnosis not present

## 2022-01-20 NOTE — Progress Notes (Signed)
Chief Complaint  Patient presents with   Wrist Injury    Yesterday fell over a drop cord Right wrist fracture    Chief Complaint  Patient presents with   Wrist Injury    Yesterday fell over a drop cord Right wrist fracture     HPI: 76 year old female fell injured right wrist  Patient fell yesterday over drop cord.  She went to the ER she had splinting and x-rays done.  She was given pain medication but decided to take Tylenol  She is adamant that she does not want to have surgery.  Even given the risk of a deformity of the wrist she still opted against the surgery and opted for cast  Past Medical History:  Diagnosis Date   Hypertension    Thyroid disease     BP 127/69   Pulse 81   Ht 5\' 5"  (1.651 m)   Wt 190 lb (86.2 kg)   BMI 31.62 kg/m    General appearance: Well-developed well-nourished no gross deformities  Cardiovascular normal pulse and perfusion normal color without edema  Neurologically no sensation loss or deficits or pathologic reflexes  Psychological: Awake alert and oriented x3 mood and affect normal  Skin no lacerations or ulcerations no nodularity no palpable masses, no erythema or nodularity  Musculoskeletal: Right wrist minimal deformity notable but some of this is masked by swelling the skin is intact.  She is tender at the distal radius.  Range of motion is limited by pain.  No instability of the wrist joint.  This is confirmed by x-ray.  Motor exam fingers normal.  Imaging my independent interpretation of the outside images show a characteristic Colles' fracture with dorsal angulation of the distal fragment  A/P  Distal radius fracture right wrist  Patient opted for casting  Short arm cast applied  X-ray out of plaster in 6 to 8 weeks

## 2022-02-10 ENCOUNTER — Ambulatory Visit: Payer: Medicare HMO | Admitting: Nurse Practitioner

## 2022-02-20 ENCOUNTER — Encounter: Payer: Self-pay | Admitting: Nurse Practitioner

## 2022-02-20 ENCOUNTER — Ambulatory Visit (INDEPENDENT_AMBULATORY_CARE_PROVIDER_SITE_OTHER): Payer: Medicare HMO | Admitting: Nurse Practitioner

## 2022-02-20 VITALS — BP 124/73 | HR 80 | Temp 98.1°F | Resp 20 | Ht 65.0 in | Wt 176.0 lb

## 2022-02-20 DIAGNOSIS — I10 Essential (primary) hypertension: Secondary | ICD-10-CM | POA: Diagnosis not present

## 2022-02-20 DIAGNOSIS — Z6829 Body mass index (BMI) 29.0-29.9, adult: Secondary | ICD-10-CM | POA: Diagnosis not present

## 2022-02-20 DIAGNOSIS — E039 Hypothyroidism, unspecified: Secondary | ICD-10-CM

## 2022-02-20 MED ORDER — LISINOPRIL 40 MG PO TABS: 40.0000 mg | ORAL_TABLET | Freq: Every day | ORAL | 1 refills | Status: DC

## 2022-02-20 MED ORDER — LEVOTHYROXINE SODIUM 50 MCG PO TABS: 50.0000 ug | ORAL_TABLET | Freq: Every day | ORAL | 1 refills | Status: DC

## 2022-02-20 NOTE — Progress Notes (Signed)
Subjective:    Patient ID: CHRISTEL BAI, female    DOB: [birth year], 76 y.o.   MRN: [id number]   Chief Complaint: medical management of chronic issues     HPI:  JOHNIYA DURFEE is a 76 y.o. who identifies as a female who was assigned female at birth.   Social history: Lives with: by herself but helps her ex husband with his health issues Work history: retired   Scientist, forensic in today for follow up of the following chronic medical issues:  1. Essential hypertension No c/o chest pain, sob or headache. DOes not check blood pressure at home. BP Readings from Last 3 Encounters:  01/20/22 127/69  01/19/22 (!) 159/71  08/05/21 129/72     2. Acquired hypothyroidism No problems that aware of. Lab Results  Component Value Date   TSH 2.740 08/05/2021     3. BMI 32.0-32.9,adult Weight is down 14 lbs. Wt Readings from Last 3 Encounters:  02/20/22 176 lb (79.8 kg)  01/20/22 190 lb (86.2 kg)  01/19/22 190 lb (86.2 kg)   BMI Readings from Last 3 Encounters:  02/20/22 29.29 kg/m  01/20/22 31.62 kg/m  01/19/22 31.62 kg/m     New complaints: Patient fell and broke her wrist 4 weeks ago. Is in short arm cast. Denies any pain.  Allergies  Allergen Reactions   Sulfa Antibiotics Rash   Outpatient Encounter Medications as of 02/20/2022  Medication Sig   aspirin 81 MG tablet Take 81 mg by mouth daily.   diclofenac sodium (VOLTAREN) 1 % GEL Apply 4 g topically 4 (four) times daily. (knee pain). Ok to give OTC version if not covered by insurance   fluticasone (CUTIVATE) 0.05 % cream APPLY CREAM TOPICALLY TWICE DAILY   ketoconazole (NIZORAL) 2 % cream APPLY CREAM TOPICALLY ONCE DAILY   levothyroxine (SYNTHROID) 50 MCG tablet Take 1 tablet (50 mcg total) by mouth daily.   lisinopril (ZESTRIL) 40 MG tablet Take 1 tablet (40 mg total) by mouth daily.   oxyCODONE-acetaminophen (PERCOCET/ROXICET) 5-325 MG tablet Take 1 tablet by mouth every 6 (six) hours as needed for severe pain.    No facility-administered encounter medications on file as of 02/20/2022.    Past Surgical History:  Procedure Laterality Date   CESAREAN SECTION      Family History  Problem Relation Age of Onset   Diabetes Mother    Heart attack Father    Heart disease Father    Diabetes Sister    Celiac disease Son    Arthritis Sister       Controlled substance contract: n/a     Review of Systems  Constitutional:  Negative for diaphoresis.  Eyes:  Negative for pain.  Respiratory:  Negative for shortness of breath.   Cardiovascular:  Negative for chest pain, palpitations and leg swelling.  Gastrointestinal:  Negative for abdominal pain.  Endocrine: Negative for polydipsia.  Skin:  Negative for rash.  Neurological:  Negative for dizziness, weakness and headaches.  Hematological:  Does not bruise/bleed easily.  All other systems reviewed and are negative.      Objective:   Physical Exam Vitals and nursing note reviewed.  Constitutional:      General: She is not in acute distress.    Appearance: Normal appearance. She is well-developed.  HENT:     Head: Normocephalic.     Right Ear: Tympanic membrane normal.     Left Ear: Tympanic membrane normal.     Nose: Nose normal.  Mouth/Throat:     Mouth: Mucous membranes are moist.  Eyes:     Pupils: Pupils are equal, round, and reactive to light.  Neck:     Vascular: No carotid bruit or JVD.  Cardiovascular:     Rate and Rhythm: Normal rate and regular rhythm.     Heart sounds: Normal heart sounds.  Pulmonary:     Effort: Pulmonary effort is normal. No respiratory distress.     Breath sounds: Normal breath sounds. No wheezing or rales.  Chest:     Chest wall: No tenderness.  Abdominal:     General: Bowel sounds are normal. There is no distension or abdominal bruit.     Palpations: Abdomen is soft. There is no hepatomegaly, splenomegaly, mass or pulsatile mass.     Tenderness: There is no abdominal tenderness.   Musculoskeletal:        General: Normal range of motion.     Cervical back: Normal range of motion and neck supple.  Lymphadenopathy:     Cervical: No cervical adenopathy.  Skin:    General: Skin is warm and dry.  Neurological:     Mental Status: She is alert and oriented to person, place, and time.     Deep Tendon Reflexes: Reflexes are normal and symmetric.  Psychiatric:        Behavior: Behavior normal.        Thought Content: Thought content normal.        Judgment: Judgment normal.    BP 124/73   Pulse 80   Temp 98.1 F (36.7 C) (Temporal)   Resp 20   Ht '5\' 5"'  (1.651 m)   Wt 176 lb (79.8 kg)   SpO2 96%   BMI 29.29 kg/m         Assessment & Plan:   LING FLESCH comes in today with chief complaint of Medical Management of Chronic Issues   Diagnosis and orders addressed:  1. Essential hypertension Low sodium diet - lisinopril (ZESTRIL) 40 MG tablet; Take 1 tablet (40 mg total) by mouth daily.  Dispense: 90 tablet; Refill: 1 - CBC with Differential/Platelet - CMP14+EGFR - Lipid panel  2. Acquired hypothyroidism Labs pending - levothyroxine (SYNTHROID) 50 MCG tablet; Take 1 tablet (50 mcg total) by mouth daily.  Dispense: 90 tablet; Refill: 1 - Thyroid Panel With TSH  3. BMI 29.0-29.9,adult Discussed diet and exercise for person with BMI >25 Will recheck weight in 3-6 months    Labs pending Health Maintenance reviewed Diet and exercise encouraged  Follow up plan: 6 month    [place], FNP

## 2022-02-20 NOTE — Patient Instructions (Signed)

## 2022-02-21 LAB — CMP14+EGFR
ALT: 8 IU/L (ref 0–32)
AST: 11 IU/L (ref 0–40)
Albumin/Globulin Ratio: 1.9 (ref 1.2–2.2)
Albumin: 4.3 g/dL (ref 3.7–4.7)
Alkaline Phosphatase: 141 IU/L — ABNORMAL HIGH (ref 44–121)
BUN/Creatinine Ratio: 18 (ref 12–28)
BUN: 18 mg/dL (ref 8–27)
Bilirubin Total: 0.4 mg/dL (ref 0.0–1.2)
CO2: 23 mmol/L (ref 20–29)
Calcium: 9.5 mg/dL (ref 8.7–10.3)
Chloride: 103 mmol/L (ref 96–106)
Creatinine, Ser: 0.98 mg/dL (ref 0.57–1.00)
Globulin, Total: 2.3 g/dL (ref 1.5–4.5)
Glucose: 104 mg/dL — ABNORMAL HIGH (ref 70–99)
Potassium: 3.9 mmol/L (ref 3.5–5.2)
Sodium: 138 mmol/L (ref 134–144)
Total Protein: 6.6 g/dL (ref 6.0–8.5)
eGFR: 60 mL/min/{1.73_m2} (ref >59)

## 2022-02-21 LAB — LIPID PANEL
Chol/HDL Ratio: 4.1 ratio (ref 0.0–4.4)
Cholesterol, Total: 174 mg/dL (ref 100–199)
HDL: 42 mg/dL (ref >39)
LDL Chol Calc (NIH): 119 mg/dL — ABNORMAL HIGH (ref 0–99)
Triglycerides: 69 mg/dL (ref 0–149)
VLDL Cholesterol Cal: 13 mg/dL (ref 5–40)

## 2022-02-21 LAB — CBC WITH DIFFERENTIAL/PLATELET
Basophils Absolute: 0.1 10*3/uL (ref 0.0–0.2)
Basos: 1 %
EOS (ABSOLUTE): 0.1 10*3/uL (ref 0.0–0.4)
Eos: 2 %
Hematocrit: 44.7 % (ref 34.0–46.6)
Hemoglobin: 14.5 g/dL (ref 11.1–15.9)
Immature Grans (Abs): 0 10*3/uL (ref 0.0–0.1)
Immature Granulocytes: 0 %
Lymphocytes Absolute: 1.9 10*3/uL (ref 0.7–3.1)
Lymphs: 31 %
MCH: 27.7 pg (ref 26.6–33.0)
MCHC: 32.4 g/dL (ref 31.5–35.7)
MCV: 85 fL (ref 79–97)
Monocytes Absolute: 0.5 10*3/uL (ref 0.1–0.9)
Monocytes: 9 %
Neutrophils Absolute: 3.5 10*3/uL (ref 1.4–7.0)
Neutrophils: 57 %
Platelets: 242 10*3/uL (ref 150–450)
RBC: 5.24 x10E6/uL (ref 3.77–5.28)
RDW: 13.5 % (ref 11.7–15.4)
WBC: 6 10*3/uL (ref 3.4–10.8)

## 2022-02-21 LAB — THYROID PANEL WITH TSH
Free Thyroxine Index: 2.3 (ref 1.2–4.9)
T3 Uptake Ratio: 29 % (ref 24–39)
T4, Total: 7.9 ug/dL (ref 4.5–12.0)
TSH: 2.33 u[IU]/mL (ref 0.450–4.500)

## 2022-03-03 DIAGNOSIS — S52502D Unspecified fracture of the lower end of left radius, subsequent encounter for closed fracture with routine healing: Secondary | ICD-10-CM | POA: Insufficient documentation

## 2022-03-03 HISTORY — DX: Unspecified fracture of the lower end of left radius, subsequent encounter for closed fracture with routine healing: S52.502D

## 2022-03-04 ENCOUNTER — Encounter: Payer: Self-pay | Admitting: Orthopedic Surgery

## 2022-03-04 ENCOUNTER — Ambulatory Visit (INDEPENDENT_AMBULATORY_CARE_PROVIDER_SITE_OTHER): Payer: Medicare HMO

## 2022-03-04 ENCOUNTER — Ambulatory Visit (INDEPENDENT_AMBULATORY_CARE_PROVIDER_SITE_OTHER): Payer: Medicare HMO | Admitting: Orthopedic Surgery

## 2022-03-04 DIAGNOSIS — S39012A Strain of muscle, fascia and tendon of lower back, initial encounter: Secondary | ICD-10-CM | POA: Diagnosis not present

## 2022-03-04 DIAGNOSIS — S52502D Unspecified fracture of the lower end of left radius, subsequent encounter for closed fracture with routine healing: Secondary | ICD-10-CM

## 2022-03-04 DIAGNOSIS — S52602D Unspecified fracture of lower end of left ulna, subsequent encounter for closed fracture with routine healing: Secondary | ICD-10-CM | POA: Diagnosis not present

## 2022-03-04 NOTE — Patient Instructions (Addendum)
3x a day exercises the hand   Wear brace for 4 weeks then just as needed   Ok to drive

## 2022-03-04 NOTE — Progress Notes (Signed)
FOLLOW UP   Encounter Diagnosis  Name Primary?   Closed fracture distal radius and ulna, left, with routine healing, subsequent encounter 01/19/22 Yes     Chief Complaint  Patient presents with   Wrist Injury    Right wrist fracture 01/19/22      The patient is 76 years old she opted for nonoperative treatment for a distal radius fracture.  She got some cast irritation proximally at the edge of the cast but otherwise did well  Out of plaster examination reveals that she has minimal tenderness at the fracture site no major deformity she can move all her fingers  X-rays show no change in position of the dorsally displaced apex volar angulated distal radius fracture with good healing  Recommend removable splint  Active range of motion  No follow-up unless she has problems she can remove the brace after 4 weeks

## 2022-04-16 ENCOUNTER — Other Ambulatory Visit: Payer: Self-pay | Admitting: Nurse Practitioner

## 2022-04-16 DIAGNOSIS — B372 Candidiasis of skin and nail: Secondary | ICD-10-CM

## 2022-07-02 IMAGING — DX DG WRIST COMPLETE 3+V*R*
4 series · 4 of 4 positions shown · non-contrast
Comparison: None Available.

CLINICAL DATA: Recent trip and fall with wrist pain, initial
encounter

EXAM:
RIGHT WRIST - COMPLETE 3+ VIEW

[wrist ap]
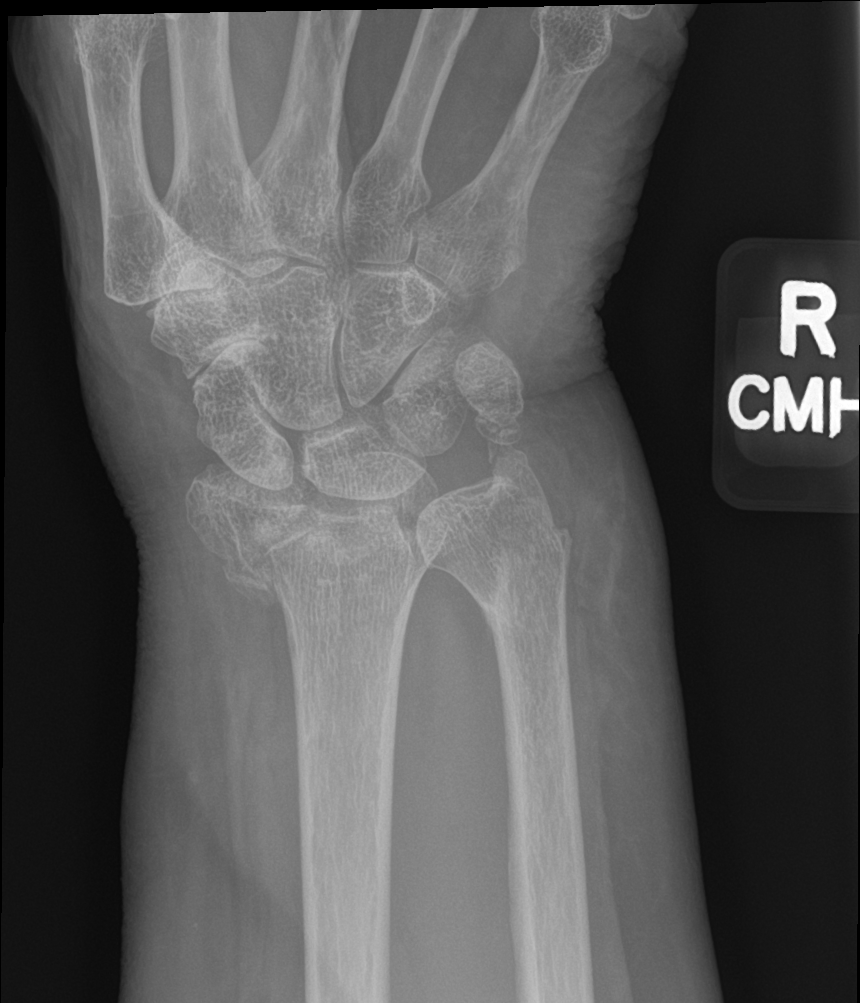

[wrist obl]
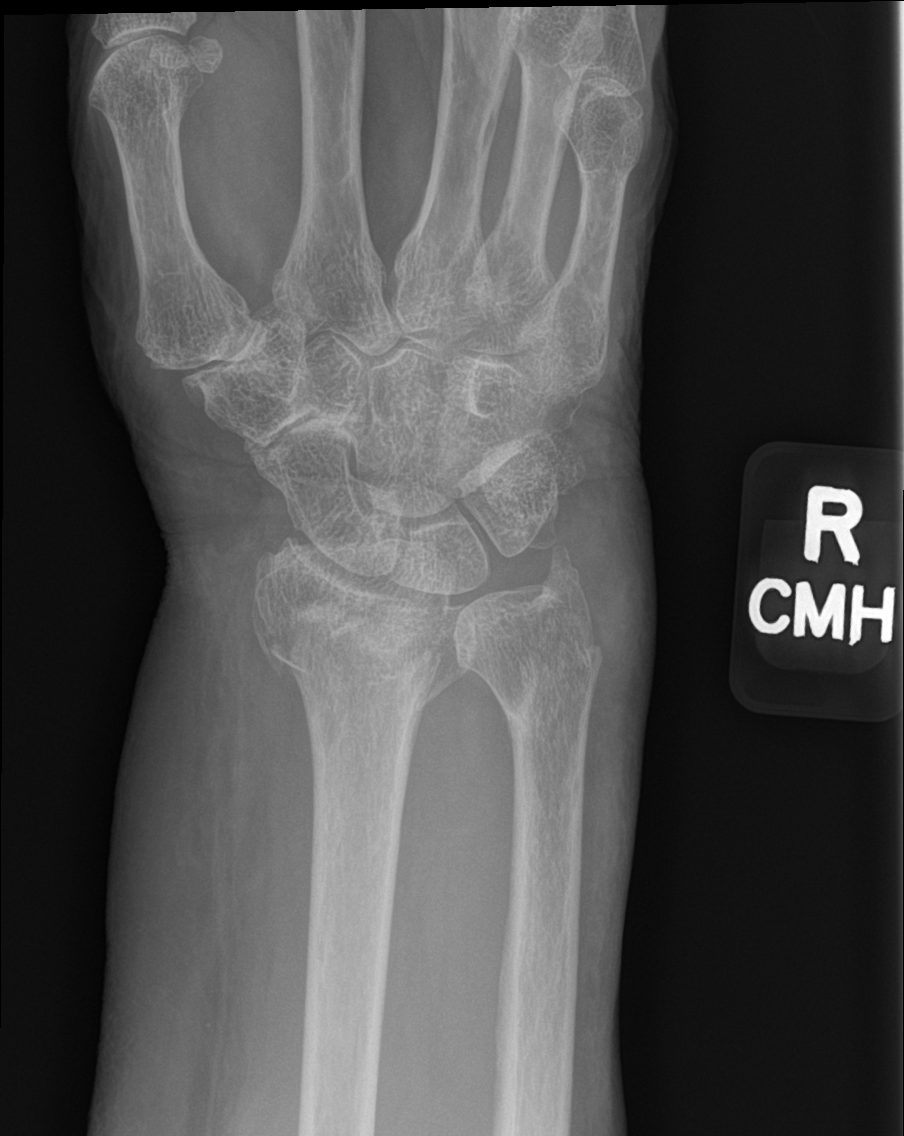

[wrist lat]
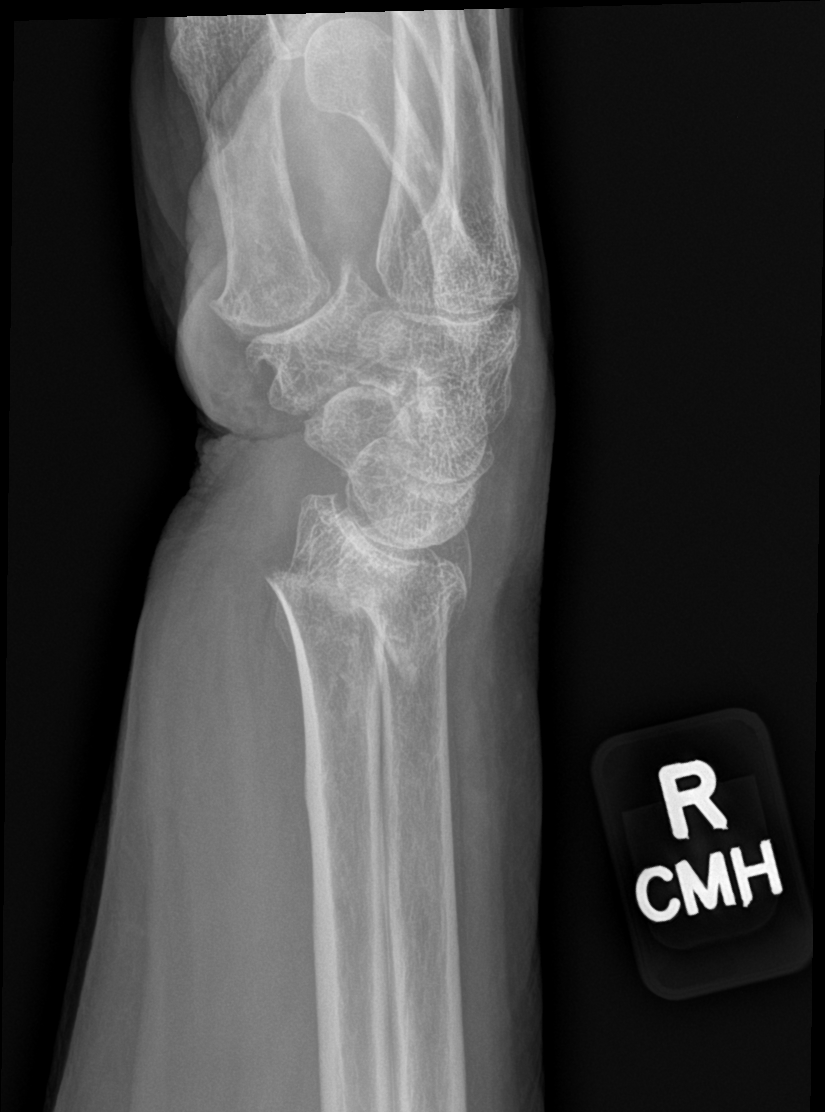

[wrist scaphoid]
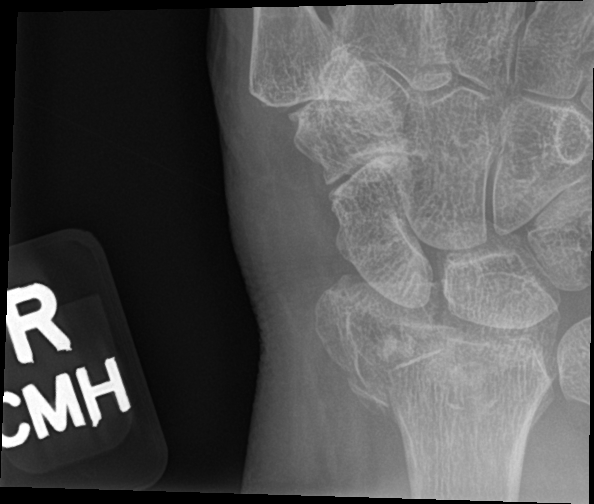

[4 of 4 positions shown; findings below may reference images not displayed]

FINDINGS: Comminuted distal radial fracture is noted without articular
involvement. Impaction and posterior angulation are noted at the
fracture site. The distal ulna shows an impaction fracture in the
metaphysis. Ulnar styloid fracture is noted as well. Degenerative
changes at the first [facility] joint are noted. Soft tissue swelling about
the wrist is seen.
IMPRESSION: Distal radial and ulnar fractures with mild soft tissue swelling.

Degenerative changes of the first [facility] joint.

## 2022-07-06 ENCOUNTER — Telehealth: Payer: Self-pay | Admitting: Nurse Practitioner

## 2022-07-06 NOTE — Telephone Encounter (Signed)
Patient wants to switch to a female provider. Please call back.

## 2022-07-06 NOTE — Telephone Encounter (Signed)
Ok to switch 

## 2022-07-06 NOTE — Telephone Encounter (Signed)
okay

## 2022-07-06 NOTE — Telephone Encounter (Signed)
That is fine 

## 2022-08-07 ENCOUNTER — Other Ambulatory Visit: Payer: Self-pay | Admitting: Nurse Practitioner

## 2022-08-07 DIAGNOSIS — B372 Candidiasis of skin and nail: Secondary | ICD-10-CM

## 2022-08-25 ENCOUNTER — Ambulatory Visit: Payer: Medicare HMO | Admitting: Nurse Practitioner

## 2022-08-26 ENCOUNTER — Ambulatory Visit (INDEPENDENT_AMBULATORY_CARE_PROVIDER_SITE_OTHER): Payer: Medicare HMO | Admitting: Family Medicine

## 2022-08-26 ENCOUNTER — Encounter: Payer: Self-pay | Admitting: Family Medicine

## 2022-08-26 VITALS — BP 139/79 | HR 79 | Temp 98.3°F | Ht 65.0 in | Wt 172.8 lb

## 2022-08-26 DIAGNOSIS — Z1159 Encounter for screening for other viral diseases: Secondary | ICD-10-CM | POA: Diagnosis not present

## 2022-08-26 DIAGNOSIS — E039 Hypothyroidism, unspecified: Secondary | ICD-10-CM | POA: Diagnosis not present

## 2022-08-26 DIAGNOSIS — I10 Essential (primary) hypertension: Secondary | ICD-10-CM | POA: Diagnosis not present

## 2022-08-26 NOTE — Progress Notes (Signed)
Subjective:  Patient ID: Meredith Ruiz, female    DOB: [birth year]  Age: 77 y.o. MRN: [id number]  CC: Establish Care   HPI Junette Bernat [facility] presents as a new patient for me. She is here for  follow-up of hypertension. Patient has no history of headache chest pain or shortness of breath or recent cough. Patient also denies symptoms of TIA such as focal numbness or weakness. Patient denies side effects from medication. States taking it regularly.   follow-up on  thyroid. The patient has a history of hypothyroidism for many years. It has been stable recently. Pt. denies any change in  voice, loss of hair, heat or cold intolerance. Energy level has been adequate to good. Patient denies constipation and diarrhea. No myxedema. Medication is as noted below. Verified that pt is taking it daily on an empty stomach. Well tolerated.  Husband died of cirrhosis 3 months.   History Margaux has a past medical history of Closed fracture distal radius and ulna, left, with routine healing, subsequent encounter 01/19/22 (03/03/2022), Hypertension, and Thyroid disease.   She has a past surgical history that includes Cesarean section.   Her family history includes Arthritis in her sister; Celiac disease in her son; Diabetes in her mother and sister; Heart attack in her father; Heart disease in her father.She reports that she quit smoking about 48 years ago. Her smoking use included cigarettes. She has a 0.13 pack-year smoking history. She has never used smokeless tobacco. She reports that she does not drink alcohol and does not use drugs.  Current Outpatient Medications on File Prior to Visit  Medication Sig Dispense Refill   aspirin 81 MG tablet Take 81 mg by mouth daily.     diclofenac sodium (VOLTAREN) 1 % GEL Apply 4 g topically 4 (four) times daily. (knee pain). Ok to give OTC version if not covered by insurance 200 g 1   fluticasone (CUTIVATE) 0.05 % cream APPLY  CREAM TOPICALLY TWICE DAILY 30 g 0    ketoconazole (NIZORAL) 2 % cream APPLY  CREAM TOPICALLY ONCE DAILY 15 g 0   levothyroxine (SYNTHROID) 50 MCG tablet Take 1 tablet (50 mcg total) by mouth daily. 90 tablet 1   lisinopril (ZESTRIL) 40 MG tablet Take 1 tablet (40 mg total) by mouth daily. 90 tablet 1   No current facility-administered medications on file prior to visit.    ROS Review of Systems  Constitutional: Negative.   HENT:  Negative for congestion.   Eyes:  Negative for visual disturbance.  Respiratory:  Negative for shortness of breath.   Cardiovascular:  Negative for chest pain.  Gastrointestinal:  Negative for abdominal pain, constipation, diarrhea, nausea and vomiting.  Genitourinary:  Negative for difficulty urinating.  Musculoskeletal:  Negative for arthralgias and myalgias.  Neurological:  Negative for headaches.  Psychiatric/Behavioral:  Negative for sleep disturbance.     Objective:  BP 139/79   Pulse 79   Temp 98.3 F (36.8 C)   Ht 5\' 5"  (1.651 m)   Wt 172 lb 12.8 oz (78.4 kg)   SpO2 92%   BMI 28.76 kg/m   BP Readings from Last 3 Encounters:  08/26/22 139/79  02/20/22 124/73  01/20/22 127/69    Wt Readings from Last 3 Encounters:  08/26/22 172 lb 12.8 oz (78.4 kg)  02/20/22 176 lb (79.8 kg)  01/20/22 190 lb (86.2 kg)     Physical Exam Constitutional:      General: She is not in acute distress.  Appearance: She is well-developed.  HENT:     Head: Normocephalic and atraumatic.  Eyes:     Conjunctiva/sclera: Conjunctivae normal.     Pupils: Pupils are equal, round, and reactive to light.  Neck:     Thyroid: No thyromegaly.  Cardiovascular:     Rate and Rhythm: Normal rate and regular rhythm.     Heart sounds: Normal heart sounds. No murmur heard. Pulmonary:     Effort: Pulmonary effort is normal. No respiratory distress.     Breath sounds: Normal breath sounds. No wheezing or rales.  Abdominal:     General: Bowel sounds are normal. There is no distension.     Palpations:  Abdomen is soft.     Tenderness: There is no abdominal tenderness.  Musculoskeletal:        General: Normal range of motion.     Cervical back: Normal range of motion and neck supple.  Lymphadenopathy:     Cervical: No cervical adenopathy.  Skin:    General: Skin is warm and dry.  Neurological:     Mental Status: She is alert and oriented to person, place, and time.  Psychiatric:        Behavior: Behavior normal.        Thought Content: Thought content normal.        Judgment: Judgment normal.       Assessment & Plan:   Ladajah was seen today for establish care.  Diagnoses and all orders for this visit:  Need for hepatitis C screening test -     Hepatitis C antibody  Acquired hypothyroidism -     CBC with Differential/Platelet -     CMP14+EGFR -     TSH + free T4  Essential hypertension -     Lipid panel    Follow-up: Return in about 6 months (around 02/24/2023).  Claretta Fraise, M.D.

## 2022-08-27 LAB — CMP14+EGFR
ALT: 12 IU/L (ref 0–32)
AST: 15 IU/L (ref 0–40)
Albumin/Globulin Ratio: 1.4 (ref 1.2–2.2)
Albumin: 4.1 g/dL (ref 3.8–4.8)
Alkaline Phosphatase: 144 IU/L — ABNORMAL HIGH (ref 44–121)
BUN/Creatinine Ratio: 15 (ref 12–28)
BUN: 14 mg/dL (ref 8–27)
Bilirubin Total: 0.5 mg/dL (ref 0.0–1.2)
CO2: 25 mmol/L (ref 20–29)
Calcium: 9.7 mg/dL (ref 8.7–10.3)
Chloride: 102 mmol/L (ref 96–106)
Creatinine, Ser: 0.96 mg/dL (ref 0.57–1.00)
Globulin, Total: 3 g/dL (ref 1.5–4.5)
Glucose: 95 mg/dL (ref 70–99)
Potassium: 4.6 mmol/L (ref 3.5–5.2)
Sodium: 141 mmol/L (ref 134–144)
Total Protein: 7.1 g/dL (ref 6.0–8.5)
eGFR: 62 mL/min/{1.73_m2} (ref >59)

## 2022-08-27 LAB — CBC WITH DIFFERENTIAL/PLATELET
Basophils Absolute: 0 10*3/uL (ref 0.0–0.2)
Basos: 1 %
EOS (ABSOLUTE): 0.1 10*3/uL (ref 0.0–0.4)
Eos: 1 %
Hematocrit: 44 % (ref 34.0–46.6)
Hemoglobin: 14.4 g/dL (ref 11.1–15.9)
Immature Grans (Abs): 0 10*3/uL (ref 0.0–0.1)
Immature Granulocytes: 0 %
Lymphocytes Absolute: 1.7 10*3/uL (ref 0.7–3.1)
Lymphs: 28 %
MCH: 28 pg (ref 26.6–33.0)
MCHC: 32.7 g/dL (ref 31.5–35.7)
MCV: 86 fL (ref 79–97)
Monocytes Absolute: 0.4 10*3/uL (ref 0.1–0.9)
Monocytes: 6 %
Neutrophils Absolute: 4 10*3/uL (ref 1.4–7.0)
Neutrophils: 64 %
Platelets: 254 10*3/uL (ref 150–450)
RBC: 5.14 x10E6/uL (ref 3.77–5.28)
RDW: 13.2 % (ref 11.7–15.4)
WBC: 6.2 10*3/uL (ref 3.4–10.8)

## 2022-08-27 LAB — LIPID PANEL
Chol/HDL Ratio: 3.5 ratio (ref 0.0–4.4)
Cholesterol, Total: 195 mg/dL (ref 100–199)
HDL: 56 mg/dL (ref >39)
LDL Chol Calc (NIH): 126 mg/dL — ABNORMAL HIGH (ref 0–99)
Triglycerides: 69 mg/dL (ref 0–149)
VLDL Cholesterol Cal: 13 mg/dL (ref 5–40)

## 2022-08-27 LAB — TSH+FREE T4
Free T4: 1.26 ng/dL (ref 0.82–1.77)
TSH: 3.12 u[IU]/mL (ref 0.450–4.500)

## 2022-08-27 LAB — HEPATITIS C ANTIBODY: Hep C Virus Ab: NONREACTIVE

## 2022-10-08 ENCOUNTER — Other Ambulatory Visit: Payer: Self-pay | Admitting: Nurse Practitioner

## 2022-10-08 DIAGNOSIS — I10 Essential (primary) hypertension: Secondary | ICD-10-CM

## 2022-10-15 ENCOUNTER — Encounter: Payer: Self-pay | Admitting: Radiology

## 2022-10-29 ENCOUNTER — Ambulatory Visit: Payer: Medicare HMO

## 2022-10-29 VITALS — Ht 65.0 in | Wt 180.0 lb

## 2022-10-29 DIAGNOSIS — Z0000 Encounter for general adult medical examination without abnormal findings: Secondary | ICD-10-CM

## 2022-10-29 NOTE — Progress Notes (Signed)
Subjective:   DELVIA Ruiz is a 77 y.o. female who presents for Medicare Annual (Subsequent) preventive examination. I connected with  Meredith Ruiz on 10/29/22 by a audio enabled telemedicine application and verified that I am speaking with the correct person using two identifiers.  Patient Location: Home  Provider Location: Home Office  I discussed the limitations of evaluation and management by telemedicine. The patient expressed understanding and agreed to proceed.  Review of Systems     Cardiac Risk Factors include: advanced age (>82mn, >>33women)     Objective:    Today's Vitals   10/29/22 0928  Weight: 180 lb (81.6 kg)  Height: '5\' 5"'$  (1.651 m)   Body mass index is 29.95 kg/m.     10/29/2022    9:31 AM 10/27/2021    9:53 AM 10/24/2020    9:58 AM 10/17/2019   10:28 AM 09/03/2016    5:30 PM  Advanced Directives  Does Patient Have a Medical Advance Directive? No No No No No  Would patient like information on creating a medical advance directive? No - Patient declined No - Patient declined No - Patient declined No - Patient declined No - Patient declined    Current Medications (verified) Outpatient Encounter Medications as of 10/29/2022  Medication Sig   aspirin 81 MG tablet Take 81 mg by mouth daily.   diclofenac sodium (VOLTAREN) 1 % GEL Apply 4 g topically 4 (four) times daily. (knee pain). Ok to give OTC version if not covered by insurance   fluticasone (CUTIVATE) 0.05 % cream APPLY  CREAM TOPICALLY TWICE DAILY   ketoconazole (NIZORAL) 2 % cream APPLY  CREAM TOPICALLY ONCE DAILY   levothyroxine (SYNTHROID) 50 MCG tablet Take 1 tablet (50 mcg total) by mouth daily.   lisinopril (ZESTRIL) 40 MG tablet Take 1 tablet by mouth once daily   No facility-administered encounter medications on file as of 10/29/2022.    Allergies (verified) Sulfa antibiotics   History: Past Medical History:  Diagnosis Date   Closed fracture distal radius and ulna, left, with  routine healing, subsequent encounter 01/19/22 03/03/2022   Hypertension    Thyroid disease    Past Surgical History:  Procedure Laterality Date   CESAREAN SECTION     Family History  Problem Relation Age of Onset   Diabetes Mother    Heart attack Father    Heart disease Father    Diabetes Sister    Celiac disease Son    Arthritis Sister    Social History   Socioeconomic History   Marital status: Legally Separated    Spouse name: Not on file   Number of children: 2   Years of education: 9   Highest education level: 9th grade  Occupational History   Occupation: Retired  Tobacco Use   Smoking status: Former    Packs/day: 0.25    Years: 0.50    Additional pack years: 0.00    Total pack years: 0.13    Types: Cigarettes    Quit date: 11/21/1973    Years since quitting: 48.9   Smokeless tobacco: Never  Vaping Use   Vaping Use: Never used  Substance and Sexual Activity   Alcohol use: No   Drug use: Never   Sexual activity: Not Currently  Other Topics Concern   Not on file  Social History Narrative   2 sons live with her   Social Determinants of Health   Financial Resource Strain: Low Risk  (10/29/2022)   Overall  Financial Resource Strain (CARDIA)    Difficulty of Paying Living Expenses: Not hard at all  Food Insecurity: No Food Insecurity (10/29/2022)   Hunger Vital Sign    Worried About Running Out of Food in the Last Year: Never true    Ran Out of Food in the Last Year: Never true  Transportation Needs: No Transportation Needs (10/27/2021)   PRAPARE - Hydrologist (Medical): No    Lack of Transportation (Non-Medical): No  Physical Activity: Sufficiently Active (10/29/2022)   Exercise Vital Sign    Days of Exercise per Week: 5 days    Minutes of Exercise per Session: 30 min  Stress: No Stress Concern Present (10/29/2022)   [place]    Feeling of Stress : Not at all   Social Connections: Socially Isolated (10/29/2022)   Social Connection and Isolation Panel [NHANES]    Frequency of Communication with Friends and Family: More than three times a week    Frequency of Social Gatherings with Friends and Family: More than three times a week    Attends Religious Services: Never    Marine scientist or Organizations: No    Attends Music therapist: Never    Marital Status: Divorced    Tobacco Counseling Counseling given: Not Answered   Clinical Intake:  Pre-visit preparation completed: Yes  Pain : No/denies pain     Nutritional Risks: None Diabetes: No  How often do you need to have someone help you when you read instructions, pamphlets, or other written materials from your doctor or pharmacy?: 1 - Never  Diabetic?no   Interpreter Needed?: No  Information entered by :: Jadene Pierini, LPN   Activities of Daily Living    10/29/2022    9:31 AM  In your present state of health, do you have any difficulty performing the following activities:  Hearing? 0  Vision? 0  Difficulty concentrating or making decisions? 0  Walking or climbing stairs? 0  Dressing or bathing? 0  Doing errands, shopping? 0  Preparing Food and eating ? N  Using the Toilet? N  In the past six months, have you accidently leaked urine? N  Do you have problems with loss of bowel control? N  Managing your Medications? N  Managing your Finances? N  Housekeeping or managing your Housekeeping? N    Patient Care Team: Claretta Fraise, MD as PCP - General (Family Medicine)  Indicate any recent Medical Services you may have received from other than [facility] providers in the past year (date may be approximate).     Assessment:   This is a routine wellness examination for [place].  Hearing/Vision screen Vision Screening - Comments:: Wears rx glasses - up to date with routine eye exams with  Dr.Johnson   Dietary issues and exercise activities  discussed: Current Exercise Habits: Home exercise routine, Type of exercise: walking, Time (Minutes): 30, Frequency (Times/Week): 5, Weekly Exercise (Minutes/Week): 150, Exercise limited by: None identified   Goals Addressed             This Visit's Progress    Patient Stated   On track    10/17/2019 AWV Goal: Exercise for General Health  Patient will verbalize understanding of the benefits of increased physical activity: Exercising regularly is important. It will improve your overall fitness, flexibility, and endurance. Regular exercise also will improve your overall health. It can help you control your weight, reduce stress, and improve  your bone density. Over the next year, patient will increase physical activity as tolerated with a goal of at least 150 minutes of moderate physical activity per week.  You can tell that you are exercising at a moderate intensity if your heart starts beating faster and you start breathing faster but can still hold a conversation. Moderate-intensity exercise ideas include: Walking 1 mile (1.6 km) in about 15 minutes Biking Hiking Golfing Dancing Water aerobics Patient will verbalize understanding of everyday activities that increase physical activity by providing examples like the following: Yard work, such as: Sales promotion account executive Gardening Washing windows or floors Patient will be able to explain general safety guidelines for exercising:  Before you start a new exercise program, talk with your health care provider. Do not exercise so much that you hurt yourself, feel dizzy, or get very short of breath. Wear comfortable clothes and wear shoes with good support. Drink plenty of water while you exercise to prevent dehydration or heat stroke. Work out until your breathing and your heartbeat get faster.         Depression Screen    10/29/2022    9:30 AM 08/26/2022     2:44 PM 08/26/2022    2:40 PM 02/20/2022    9:30 AM 10/27/2021    9:51 AM 08/05/2021    8:05 AM 02/03/2021    7:59 AM  PHQ 2/9 Scores  PHQ - 2 Score 0 0 0 0 0 0 3  PHQ- 9 Score    0  0 3    Fall Risk    10/29/2022    9:29 AM 08/26/2022    2:43 PM 08/26/2022    2:40 PM 02/20/2022    9:30 AM 10/27/2021    9:50 AM  Fall Risk   Falls in the past year? 0 1 0 1 0  Number falls in past yr: 0 0  0 0  Injury with Fall? 0 1  1 0  Risk for fall due to : No Fall Risks History of fall(s)  History of fall(s) No Fall Risks  Follow up Falls prevention discussed Falls evaluation completed  Education provided Falls prevention discussed    FALL RISK PREVENTION PERTAINING TO THE HOME:  Any stairs in or around the home? No  If so, are there any without handrails? No  Home free of loose throw rugs in walkways, pet beds, electrical cords, etc? Yes  Adequate lighting in your home to reduce risk of falls? Yes   ASSISTIVE DEVICES UTILIZED TO PREVENT FALLS:  Life alert? No  Use of a cane, walker or w/c? No  Grab bars in the bathroom? Yes  Shower chair or bench in shower? Yes  Elevated toilet seat or a handicapped toilet? No          10/29/2022    9:31 AM 10/24/2020    9:59 AM 10/17/2019   10:31 AM  6CIT Screen  What Year? 0 points 0 points 0 points  What month? 0 points 0 points 0 points  What time? 0 points 0 points 0 points  Count back from 20 0 points 0 points 0 points  Months in reverse 0 points 0 points 0 points  Repeat phrase 0 points 0 points 2 points  Total Score 0 points 0 points 2 points    Immunizations Immunization History  Administered Date(s) Administered   PFIZER(Purple Top)SARS-COV-2 Vaccination 10/13/2019, 11/10/2019, 06/21/2020, 12/09/2020   Tdap  07/13/2016    TDAP status: Up to date  Flu Vaccine status: Declined, Education has been provided regarding the importance of this vaccine but patient still declined. Advised may receive this vaccine at local pharmacy or Health  Dept. Aware to provide a copy of the vaccination record if obtained from local pharmacy or Health Dept. Verbalized acceptance and understanding.  Pneumococcal vaccine status: Due, Education has been provided regarding the importance of this vaccine. Advised may receive this vaccine at local pharmacy or Health Dept. Aware to provide a copy of the vaccination record if obtained from local pharmacy or Health Dept. Verbalized acceptance and understanding.  Covid-19 vaccine status: Completed vaccines  Qualifies for Shingles Vaccine? Yes   Zostavax completed No   Shingrix Completed?: No.    Education has been provided regarding the importance of this vaccine. Patient has been advised to call insurance company to determine out of pocket expense if they have not yet received this vaccine. Advised may also receive vaccine at local pharmacy or Health Dept. Verbalized acceptance and understanding.  Screening Tests Health Maintenance  Topic Date Due   COVID-19 Vaccine (5 - 2023-24 season) 04/17/2022   MAMMOGRAM  04/28/2022   INFLUENZA VACCINE  11/15/2022 (Originally 03/17/2022)   Zoster Vaccines- Shingrix (1 of 2) 11/25/2022 (Originally 12/21/1996)   DEXA SCAN  02/21/2023 (Originally 12/22/2011)   Fecal DNA (Cologuard)  02/21/2023 (Originally 12/22/1991)   Pneumonia Vaccine 65+ Years old (1 of 1 - PCV) 08/27/2023 (Originally 12/22/2011)   Medicare Annual Wellness (AWV)  10/29/2023   DTaP/Tdap/Td (2 - Td or Tdap) 07/13/2026   Hepatitis C Screening  Completed   HPV VACCINES  Aged Out    Health Maintenance  Health Maintenance Due  Topic Date Due   COVID-19 Vaccine (5 - 2023-24 season) 04/17/2022   MAMMOGRAM  04/28/2022    Colorectal cancer screening: No longer required.   Mammogram status: No longer required due to age.  Bone Density status: Ordered declined . Pt provided with contact info and advised to call to schedule appt.  Lung Cancer Screening: (Low Dose CT Chest recommended if Age 64-80 years,  30 pack-year currently smoking OR have quit w/in 15years.) does not qualify.   Lung Cancer Screening Referral: n/a  Additional Screening:  Hepatitis C Screening: does not qualify; Completed 08/26/2022  Vision Screening: Recommended annual ophthalmology exams for early detection of glaucoma and other disorders of the eye. Is the patient up to date with their annual eye exam?  Yes  Who is the provider or what is the name of the office in which the patient attends annual eye exams? Dr.Johnson  If pt is not established with a provider, would they like to be referred to a provider to establish care? No .   Dental Screening: Recommended annual dental exams for proper oral hygiene  Community Resource Referral / Chronic Care Management: CRR required this visit?  No   CCM required this visit?  No      Plan:     I have personally reviewed and noted the following in the patient's chart:   Medical and social history Use of alcohol, tobacco or illicit drugs  Current medications and supplements including opioid prescriptions. Patient is not currently taking opioid prescriptions. Functional ability and status Nutritional status Physical activity Advanced directives List of other physicians Hospitalizations, surgeries, and ER visits in previous 12 months Vitals Screenings to include cognitive, depression, and falls Referrals and appointments  In addition, I have reviewed and discussed with patient certain  preventive protocols, quality metrics, and best practice recommendations. A written personalized care plan for preventive services as well as general preventive health recommendations were provided to patient.     Daphane Shepherd, LPN   [id number]   Nurse Notes: Due Pneumonia Vaccine

## 2022-10-29 NOTE — Patient Instructions (Signed)
Meredith Ruiz , Thank you for taking time to come for your Medicare Wellness Visit. I appreciate your ongoing commitment to your health goals. Please review the following plan we discussed and let me know if I can assist you in the future.   These are the goals we discussed:  Goals      Patient Stated     10/17/2019 AWV Goal: Exercise for General Health  Patient will verbalize understanding of the benefits of increased physical activity: Exercising regularly is important. It will improve your overall fitness, flexibility, and endurance. Regular exercise also will improve your overall health. It can help you control your weight, reduce stress, and improve your bone density. Over the next year, patient will increase physical activity as tolerated with a goal of at least 150 minutes of moderate physical activity per week.  You can tell that you are exercising at a moderate intensity if your heart starts beating faster and you start breathing faster but can still hold a conversation. Moderate-intensity exercise ideas include: Walking 1 mile (1.6 km) in about 15 minutes Biking Hiking Golfing Dancing Water aerobics Patient will verbalize understanding of everyday activities that increase physical activity by providing examples like the following: Yard work, such as: Sales promotion account executive Gardening Washing windows or floors Patient will be able to explain general safety guidelines for exercising:  Before you start a new exercise program, talk with your health care provider. Do not exercise so much that you hurt yourself, feel dizzy, or get very short of breath. Wear comfortable clothes and wear shoes with good support. Drink plenty of water while you exercise to prevent dehydration or heat stroke. Work out until your breathing and your heartbeat get faster.       Patient Stated     10/24/2020 AWV Goal: Exercise  for General Health  Patient will verbalize understanding of the benefits of increased physical activity: Exercising regularly is important. It will improve your overall fitness, flexibility, and endurance. Regular exercise also will improve your overall health. It can help you control your weight, reduce stress, and improve your bone density. Over the next year, patient will increase physical activity as tolerated with a goal of at least 150 minutes of moderate physical activity per week.  You can tell that you are exercising at a moderate intensity if your heart starts beating faster and you start breathing faster but can still hold a conversation. Moderate-intensity exercise ideas include: Walking 1 mile (1.6 km) in about 15 minutes Biking Hiking Golfing Dancing Water aerobics Patient will verbalize understanding of everyday activities that increase physical activity by providing examples like the following: Yard work, such as: Sales promotion account executive Gardening Washing windows or floors Patient will be able to explain general safety guidelines for exercising:  Before you start a new exercise program, talk with your health care provider. Do not exercise so much that you hurt yourself, feel dizzy, or get very short of breath. Wear comfortable clothes and wear shoes with good support. Drink plenty of water while you exercise to prevent dehydration or heat stroke. Work out until your breathing and your heartbeat get faster.         This is a list of the screening recommended for you and due dates:  Health Maintenance  Topic Date Due   COVID-19 Vaccine (5 - 2023-24 season) 04/17/2022  Mammogram  04/28/2022   Flu Shot  11/15/2022*   Zoster (Shingles) Vaccine (1 of 2) 11/25/2022*   DEXA scan (bone density measurement)  02/21/2023*   Cologuard (Stool DNA test)  02/21/2023*   Pneumonia Vaccine (1 of 1 - PCV)  08/27/2023*   Medicare Annual Wellness Visit  10/29/2023   DTaP/Tdap/Td vaccine (2 - Td or Tdap) 07/13/2026   Hepatitis C Screening: USPSTF Recommendation to screen - Ages 57-79 yo.  Completed   HPV Vaccine  Aged Out  *Topic was postponed. The date shown is not the original due date.    Advanced directives: Advance directive discussed with you today. I have provided a copy for you to complete at home and have notarized. Once this is complete please bring a copy in to our office so we can scan it into your chart.   Conditions/risks identified: Aim for 30 minutes of exercise or brisk walking, 6-8 glasses of water, and 5 servings of fruits and vegetables each day.   Next appointment: Follow up in one year for your annual wellness visit    Preventive Care 65 Years and Older, Female Preventive care refers to lifestyle choices and visits with your health care provider that can promote health and wellness. What does preventive care include? A yearly physical exam. This is also called an annual well check. Dental exams once or twice a year. Routine eye exams. Ask your health care provider how often you should have your eyes checked. Personal lifestyle choices, including: Daily care of your teeth and gums. Regular physical activity. Eating a healthy diet. Avoiding tobacco and drug use. Limiting alcohol use. Practicing safe sex. Taking low-dose aspirin every day. Taking vitamin and mineral supplements as recommended by your health care provider. What happens during an annual well check? The services and screenings done by your health care provider during your annual well check will depend on your age, overall health, lifestyle risk factors, and family history of disease. Counseling  Your health care provider may ask you questions about your: Alcohol use. Tobacco use. Drug use. Emotional well-being. Home and relationship well-being. Sexual activity. Eating habits. History of  falls. Memory and ability to understand (cognition). Work and work Statistician. Reproductive health. Screening  You may have the following tests or measurements: Height, weight, and BMI. Blood pressure. Lipid and cholesterol levels. These may be checked every 5 years, or more frequently if you are over 23 years old. Skin check. Lung cancer screening. You may have this screening every year starting at age 10 if you have a 30-pack-year history of smoking and currently smoke or have quit within the past 15 years. Fecal occult blood test (FOBT) of the stool. You may have this test every year starting at age 19. Flexible sigmoidoscopy or colonoscopy. You may have a sigmoidoscopy every 5 years or a colonoscopy every 10 years starting at age 15. Hepatitis C blood test. Hepatitis B blood test. Sexually transmitted disease (STD) testing. Diabetes screening. This is done by checking your blood sugar (glucose) after you have not eaten for a while (fasting). You may have this done every 1-3 years. Bone density scan. This is done to screen for osteoporosis. You may have this done starting at age 72. Mammogram. This may be done every 1-2 years. Talk to your health care provider about how often you should have regular mammograms. Talk with your health care provider about your test results, treatment options, and if necessary, the need for more tests. Vaccines  Your health care  provider may recommend certain vaccines, such as: Influenza vaccine. This is recommended every year. Tetanus, diphtheria, and acellular pertussis (Tdap, Td) vaccine. You may need a Td booster every 10 years. Zoster vaccine. You may need this after age 1. Pneumococcal 13-valent conjugate (PCV13) vaccine. One dose is recommended after age 76. Pneumococcal polysaccharide (PPSV23) vaccine. One dose is recommended after age 51. Talk to your health care provider about which screenings and vaccines you need and how often you need  them. This information is not intended to replace advice given to you by your health care provider. Make sure you discuss any questions you have with your health care provider. Document Released: 08/30/2015 Document Revised: 04/22/2016 Document Reviewed: 06/04/2015 Elsevier Interactive Patient Education  2017 [employer] Prevention in the Home Falls can cause injuries. They can happen to people of all ages. There are many things you can do to make your home safe and to help prevent falls. What can I do on the outside of my home? Regularly fix the edges of walkways and driveways and fix any cracks. Remove anything that might make you trip as you walk through a door, such as a raised step or threshold. Trim any bushes or trees on the path to your home. Use bright outdoor lighting. Clear any walking paths of anything that might make someone trip, such as rocks or tools. Regularly check to see if handrails are loose or broken. Make sure that both sides of any steps have handrails. Any raised decks and porches should have guardrails on the edges. Have any leaves, snow, or ice cleared regularly. Use sand or salt on walking paths during winter. Clean up any spills in your garage right away. This includes oil or grease spills. What can I do in the bathroom? Use night lights. Install grab bars by the toilet and in the tub and shower. Do not use towel bars as grab bars. Use non-skid mats or decals in the tub or shower. If you need to sit down in the shower, use a plastic, non-slip stool. Keep the floor dry. Clean up any water that spills on the floor as soon as it happens. Remove soap buildup in the tub or shower regularly. Attach bath mats securely with double-sided non-slip rug tape. Do not have throw rugs and other things on the floor that can make you trip. What can I do in the bedroom? Use night lights. Make sure that you have a light by your bed that is easy to reach. Do not use  any sheets or blankets that are too big for your bed. They should not hang down onto the floor. Have a firm chair that has side arms. You can use this for support while you get dressed. Do not have throw rugs and other things on the floor that can make you trip. What can I do in the kitchen? Clean up any spills right away. Avoid walking on wet floors. Keep items that you use a lot in easy-to-reach places. If you need to reach something above you, use a strong step stool that has a grab bar. Keep electrical cords out of the way. Do not use floor polish or wax that makes floors slippery. If you must use wax, use non-skid floor wax. Do not have throw rugs and other things on the floor that can make you trip. What can I do with my stairs? Do not leave any items on the stairs. Make sure that there are handrails on both  sides of the stairs and use them. Fix handrails that are broken or loose. Make sure that handrails are as long as the stairways. Check any carpeting to make sure that it is firmly attached to the stairs. Fix any carpet that is loose or worn. Avoid having throw rugs at the top or bottom of the stairs. If you do have throw rugs, attach them to the floor with carpet tape. Make sure that you have a light switch at the top of the stairs and the bottom of the stairs. If you do not have them, ask someone to add them for you. What else can I do to help prevent falls? Wear shoes that: Do not have high heels. Have rubber bottoms. Are comfortable and fit you well. Are closed at the toe. Do not wear sandals. If you use a stepladder: Make sure that it is fully opened. Do not climb a closed stepladder. Make sure that both sides of the stepladder are locked into place. Ask someone to hold it for you, if possible. Clearly mark and make sure that you can see: Any grab bars or handrails. First and last steps. Where the edge of each step is. Use tools that help you move around (mobility aids)  if they are needed. These include: Canes. Walkers. Scooters. Crutches. Turn on the lights when you go into a dark area. Replace any light bulbs as soon as they burn out. Set up your furniture so you have a clear path. Avoid moving your furniture around. If any of your floors are uneven, fix them. If there are any pets around you, be aware of where they are. Review your medicines with your doctor. Some medicines can make you feel dizzy. This can increase your chance of falling. Ask your doctor what other things that you can do to help prevent falls. This information is not intended to replace advice given to you by your health care provider. Make sure you discuss any questions you have with your health care provider. Document Released: 05/30/2009 Document Revised: 01/09/2016 Document Reviewed: 09/07/2014 Elsevier Interactive Patient Education  2017 [employer].

## 2022-11-19 ENCOUNTER — Other Ambulatory Visit: Payer: Self-pay | Admitting: Family Medicine

## 2022-11-19 DIAGNOSIS — B372 Candidiasis of skin and nail: Secondary | ICD-10-CM

## 2022-12-11 DIAGNOSIS — H5213 Myopia, bilateral: Secondary | ICD-10-CM | POA: Diagnosis not present

## 2022-12-17 DIAGNOSIS — D225 Melanocytic nevi of trunk: Secondary | ICD-10-CM | POA: Diagnosis not present

## 2023-01-07 ENCOUNTER — Other Ambulatory Visit: Payer: Self-pay | Admitting: Nurse Practitioner

## 2023-01-07 DIAGNOSIS — E039 Hypothyroidism, unspecified: Secondary | ICD-10-CM

## 2023-01-07 NOTE — Telephone Encounter (Signed)
  Prescription Request  01/07/2023  Is this a "Controlled Substance" medicine? no  Have you seen your PCP in the last 2 weeks? No pt has next appt on 02/25/23  If YES, route message to pool  -  If NO, patient needs to be scheduled for appointment.  What is the name of the medication or equipment? levothyroxine (SYNTHROID) 50 MCG tablet   Have you contacted your pharmacy to request a refill? yes   Which pharmacy would you like this sent to? [facility]   Patient notified that their request is being sent to the clinical staff for review and that they should receive a response within 2 business days.

## 2023-01-21 DIAGNOSIS — H25812 Combined forms of age-related cataract, left eye: Secondary | ICD-10-CM | POA: Diagnosis not present

## 2023-02-08 ENCOUNTER — Other Ambulatory Visit: Payer: Self-pay | Admitting: Nurse Practitioner

## 2023-02-08 DIAGNOSIS — B372 Candidiasis of skin and nail: Secondary | ICD-10-CM

## 2023-02-25 ENCOUNTER — Ambulatory Visit (INDEPENDENT_AMBULATORY_CARE_PROVIDER_SITE_OTHER): Payer: Medicare HMO | Admitting: Family Medicine

## 2023-02-25 ENCOUNTER — Encounter: Payer: Self-pay | Admitting: Family Medicine

## 2023-02-25 VITALS — BP 129/78 | HR 81 | Temp 97.0°F | Wt 182.6 lb

## 2023-02-25 DIAGNOSIS — E039 Hypothyroidism, unspecified: Secondary | ICD-10-CM | POA: Diagnosis not present

## 2023-02-25 DIAGNOSIS — Z13220 Encounter for screening for lipoid disorders: Secondary | ICD-10-CM

## 2023-02-25 DIAGNOSIS — I10 Essential (primary) hypertension: Secondary | ICD-10-CM | POA: Diagnosis not present

## 2023-02-25 MED ORDER — LISINOPRIL 40 MG PO TABS
40.0000 mg | ORAL_TABLET | Freq: Every day | ORAL | 3 refills | Status: DC
Start: 2023-02-25 — End: 2024-03-24

## 2023-02-25 MED ORDER — LEVOTHYROXINE SODIUM 50 MCG PO TABS
50.0000 ug | ORAL_TABLET | Freq: Every day | ORAL | 3 refills | Status: DC
Start: 2023-02-25 — End: 2024-03-27

## 2023-02-25 NOTE — Progress Notes (Signed)
Subjective:  Patient ID: Meredith Ruiz, female    DOB: May 02, 1946  Age: 77 y.o. MRN: [id number]  CC: Medical Management of Chronic Issues   HPI SHATERRA SANZONE presents for  follow-up of hypertension. Patient has no history of headache chest pain or shortness of breath or recent cough. Patient also denies symptoms of TIA such as focal numbness or weakness. Patient denies side effects from medication. States taking it regularly.   follow-up on  thyroid. The patient has a history of hypothyroidism for many years. It has been stable recently. Pt. denies any change in  voice, loss of hair, heat or cold intolerance. Energy level has been adequate to good. Patient denies constipation and diarrhea. No myxedema. Medication is as noted below. Verified that pt is taking it daily on an empty stomach. Well tolerated.    History Meredith Ruiz has a past medical history of Closed fracture distal radius and ulna, left, with routine healing, subsequent encounter 01/19/22 (03/03/2022), Hypertension, and Thyroid disease.   She has a past surgical history that includes Cesarean section.   Her family history includes Arthritis in her sister; Celiac disease in her son; Diabetes in her mother and sister; Heart attack in her father; Heart disease in her father.She reports that she quit smoking about 49 years ago. Her smoking use included cigarettes. She started smoking about 49 years ago. She has a 0.1 pack-year smoking history. She has never used smokeless tobacco. She reports that she does not drink alcohol and does not use drugs.  Current Outpatient Medications on File Prior to Visit  Medication Sig Dispense Refill   aspirin 81 MG tablet Take 81 mg by mouth daily.     diclofenac sodium (VOLTAREN) 1 % GEL Apply 4 g topically 4 (four) times daily. (knee pain). Ok to give OTC version if not covered by insurance 200 g 1   fluticasone (CUTIVATE) 0.05 % cream APPLY TOPICALLY TO AFFECTED AREA TWICE DAILY 30 g 0    ketoconazole (NIZORAL) 2 % cream APPLY TOPICALLY TO AFFECTED AREA ONCE DAILY 15 g 0   No current facility-administered medications on file prior to visit.    ROS Review of Systems  Constitutional: Negative.   HENT: Negative.    Eyes:  Negative for visual disturbance.  Respiratory:  Negative for shortness of breath.   Cardiovascular:  Negative for chest pain.  Gastrointestinal:  Negative for abdominal pain.  Musculoskeletal:  Negative for arthralgias.    Objective:  BP 129/78   Pulse 81   Temp (!) 97 F (36.1 C)   Wt 182 lb 9.6 oz (82.8 kg)   SpO2 93%   BMI 30.39 kg/m   BP Readings from Last 3 Encounters:  02/25/23 129/78  08/26/22 139/79  02/20/22 124/73    Wt Readings from Last 3 Encounters:  02/25/23 182 lb 9.6 oz (82.8 kg)  10/29/22 180 lb (81.6 kg)  08/26/22 172 lb 12.8 oz (78.4 kg)     Physical Exam Constitutional:      General: She is not in acute distress.    Appearance: She is well-developed.  Cardiovascular:     Rate and Rhythm: Normal rate and regular rhythm.  Pulmonary:     Breath sounds: Normal breath sounds.  Musculoskeletal:        General: Normal range of motion.  Skin:    General: Skin is warm and dry.  Neurological:     Mental Status: She is alert and oriented to person, place, and time.  Assessment & Plan:   Alaena was seen today for medical management of chronic issues.  Diagnoses and all orders for this visit:  Acquired hypothyroidism -     TSH + free T4 -     levothyroxine (SYNTHROID) 50 MCG tablet; Take 1 tablet (50 mcg total) by mouth daily.  Essential hypertension -     CBC with Differential/Platelet -     CMP14+EGFR -     lisinopril (ZESTRIL) 40 MG tablet; Take 1 tablet (40 mg total) by mouth daily.  Lipid screening -     Lipid panel   Allergies as of 02/25/2023       Reactions   Sulfa Antibiotics Rash        Medication List        Accurate as of February 25, 2023  9:16 AM. If you have any questions,  ask your nurse or doctor.          aspirin 81 MG tablet Take 81 mg by mouth daily.   diclofenac sodium 1 % Gel Commonly known as: VOLTAREN Apply 4 g topically 4 (four) times daily. (knee pain). Ok to give OTC version if not covered by insurance   fluticasone 0.05 % cream Commonly known as: CUTIVATE APPLY TOPICALLY TO AFFECTED AREA TWICE DAILY   ketoconazole 2 % cream Commonly known as: NIZORAL APPLY TOPICALLY TO AFFECTED AREA ONCE DAILY   levothyroxine 50 MCG tablet Commonly known as: SYNTHROID Take 1 tablet (50 mcg total) by mouth daily.   lisinopril 40 MG tablet Commonly known as: ZESTRIL Take 1 tablet (40 mg total) by mouth daily.        Meds ordered this encounter  Medications   levothyroxine (SYNTHROID) 50 MCG tablet    Sig: Take 1 tablet (50 mcg total) by mouth daily.    Dispense:  90 tablet    Refill:  3   lisinopril (ZESTRIL) 40 MG tablet    Sig: Take 1 tablet (40 mg total) by mouth daily.    Dispense:  90 tablet    Refill:  3      Follow-up: Return in about 6 months (around 08/28/2023) for hypertension, Hypothyroidism.  Mechele Claude, M.D.

## 2023-02-26 LAB — CMP14+EGFR
ALT: 10 IU/L (ref 0–32)
AST: 15 IU/L (ref 0–40)
Albumin: 4.2 g/dL (ref 3.8–4.8)
Alkaline Phosphatase: 129 IU/L — ABNORMAL HIGH (ref 44–121)
BUN/Creatinine Ratio: 17 (ref 12–28)
BUN: 17 mg/dL (ref 8–27)
Bilirubin Total: 0.5 mg/dL (ref 0.0–1.2)
CO2: 23 mmol/L (ref 20–29)
Calcium: 9.4 mg/dL (ref 8.7–10.3)
Chloride: 103 mmol/L (ref 96–106)
Creatinine, Ser: 0.98 mg/dL (ref 0.57–1.00)
Globulin, Total: 2.4 g/dL (ref 1.5–4.5)
Glucose: 110 mg/dL — ABNORMAL HIGH (ref 70–99)
Potassium: 4.5 mmol/L (ref 3.5–5.2)
Sodium: 140 mmol/L (ref 134–144)
Total Protein: 6.6 g/dL (ref 6.0–8.5)
eGFR: 60 mL/min/{1.73_m2} (ref >59)

## 2023-02-26 LAB — LIPID PANEL
Chol/HDL Ratio: 3.7 ratio (ref 0.0–4.4)
Cholesterol, Total: 175 mg/dL (ref 100–199)
HDL: 47 mg/dL (ref >39)
LDL Chol Calc (NIH): 118 mg/dL — ABNORMAL HIGH (ref 0–99)
Triglycerides: 52 mg/dL (ref 0–149)
VLDL Cholesterol Cal: 10 mg/dL (ref 5–40)

## 2023-02-26 LAB — CBC WITH DIFFERENTIAL/PLATELET
Basophils Absolute: 0 10*3/uL (ref 0.0–0.2)
Basos: 1 %
EOS (ABSOLUTE): 0.1 10*3/uL (ref 0.0–0.4)
Eos: 1 %
Hematocrit: 40.5 % (ref 34.0–46.6)
Hemoglobin: 13.1 g/dL (ref 11.1–15.9)
Immature Grans (Abs): 0 10*3/uL (ref 0.0–0.1)
Immature Granulocytes: 0 %
Lymphocytes Absolute: 1.3 10*3/uL (ref 0.7–3.1)
Lymphs: 23 %
MCH: 27.9 pg (ref 26.6–33.0)
MCHC: 32.3 g/dL (ref 31.5–35.7)
MCV: 86 fL (ref 79–97)
Monocytes Absolute: 0.4 10*3/uL (ref 0.1–0.9)
Monocytes: 7 %
Neutrophils Absolute: 3.6 10*3/uL (ref 1.4–7.0)
Neutrophils: 68 %
Platelets: 232 10*3/uL (ref 150–450)
RBC: 4.69 x10E6/uL (ref 3.77–5.28)
RDW: 13.4 % (ref 11.7–15.4)
WBC: 5.4 10*3/uL (ref 3.4–10.8)

## 2023-02-26 LAB — TSH+FREE T4
Free T4: 1.36 ng/dL (ref 0.82–1.77)
TSH: 2.94 u[IU]/mL (ref 0.450–4.500)

## 2023-02-27 NOTE — Progress Notes (Signed)
Hello Mellani,  Your lab result is normal and/or stable.Some minor variations that are not significant are commonly marked abnormal, but do not represent any medical problem for you.  Best regards, Mechele Claude, M.D.

## 2023-03-17 ENCOUNTER — Encounter: Payer: Self-pay | Admitting: Nurse Practitioner

## 2023-03-17 ENCOUNTER — Ambulatory Visit (INDEPENDENT_AMBULATORY_CARE_PROVIDER_SITE_OTHER): Payer: Medicare HMO | Admitting: Nurse Practitioner

## 2023-03-17 ENCOUNTER — Other Ambulatory Visit (HOSPITAL_COMMUNITY)
Admission: RE | Admit: 2023-03-17 | Discharge: 2023-03-17 | Disposition: A | Discharge status: Home / Self Care | Payer: Medicare HMO | Source: Ambulatory Visit | Attending: Nurse Practitioner | Admitting: Nurse Practitioner

## 2023-03-17 ENCOUNTER — Encounter: Payer: Medicare HMO | Admitting: Family Medicine

## 2023-03-17 VITALS — BP 139/80 | HR 68 | Temp 98.3°F | Ht 65.0 in | Wt 188.8 lb

## 2023-03-17 DIAGNOSIS — Z01419 Encounter for gynecological examination (general) (routine) without abnormal findings: Secondary | ICD-10-CM

## 2023-03-17 DIAGNOSIS — Z0001 Encounter for general adult medical examination with abnormal findings: Secondary | ICD-10-CM

## 2023-03-17 DIAGNOSIS — Z6832 Body mass index (BMI) 32.0-32.9, adult: Secondary | ICD-10-CM | POA: Diagnosis not present

## 2023-03-17 DIAGNOSIS — E039 Hypothyroidism, unspecified: Secondary | ICD-10-CM | POA: Diagnosis not present

## 2023-03-17 DIAGNOSIS — I10 Essential (primary) hypertension: Secondary | ICD-10-CM | POA: Diagnosis not present

## 2023-03-17 DIAGNOSIS — Z0000 Encounter for general adult medical examination without abnormal findings: Secondary | ICD-10-CM | POA: Insufficient documentation

## 2023-03-17 NOTE — Progress Notes (Signed)
Complete physical exam  Patient: Meredith Ruiz   DOB: 14-Jan-1946   77 y.o. Female  MRN: [id number]  Subjective:    Chief Complaint  Patient presents with   Annual Exam    CPE with pap   Gynecologic Exam    Meredith Ruiz is a 77 y.o. female who presents today for a complete physical exam. She reports consuming a general diet. The patient does not participate in regular exercise at present." Walks around the blocks daily:.She generally feels well. She reports sleeping well. She does not have additional problems to discuss today.  Annual Phhysical Here today for PAP, Menopause 77 yrs old, no hx of abnormal pap HTN BP Readings from Last 3 Encounters: 03/17/23 139/80 02/25/23 129/78 08/26/22 139/79  No c/o chest pain, SOB or HA. Does not check BP at home BP reading from last 3 vsit Acquired Hypothurodism  Acquired hypothyroidism No problems that she is aware of   4. BMI 32.0-32.9,adult No recent weight changes PAP  Last pap was  01/30/2020 was normal Most recent fall risk assessment:    02/25/2023    8:46 AM  Fall Risk   Falls in the past year? 0     Most recent depression screenings:    03/17/2023    9:09 AM 02/25/2023    8:46 AM  PHQ 2/9 Scores  PHQ - 2 Score  0  Exception Documentation Patient refusal     Vision:Within last year and needs to have left eye surgery for cataracts, and needs appointment for surgical clearance.  Patient Active Problem List   Diagnosis Date Noted   Annual physical exam 03/17/2023   Routine medical exam 03/17/2023   Women's annual routine gynecological examination 03/17/2023   BMI 32.0-32.9,adult 10/30/2016   Acquired hypothyroidism 10/30/2016   Essential hypertension 10/30/2016   Past Medical History:  Diagnosis Date   Closed fracture distal radius and ulna, left, with routine healing, subsequent encounter 01/19/22 03/03/2022   Hypertension    Thyroid disease    Past Surgical History:  Procedure Laterality Date    CESAREAN SECTION     Social History   Tobacco Use   Smoking status: Former    Current packs/day: 0.00    Average packs/day: 0.3 packs/day for 0.5 years (0.1 ttl pk-yrs)    Types: Cigarettes    Start date: 05/23/1973    Quit date: 11/21/1973    Years since quitting: 49.3   Smokeless tobacco: Never  Vaping Use   Vaping status: Never Used  Substance Use Topics   Alcohol use: No   Drug use: Never   Social History   Socioeconomic History   Marital status: Legally Separated    Spouse name: Not on file   Number of children: 2   Years of education: 9   Highest education level: 9th grade  Occupational History   Occupation: Retired  Tobacco Use   Smoking status: Former    Current packs/day: 0.00    Average packs/day: 0.3 packs/day for 0.5 years (0.1 ttl pk-yrs)    Types: Cigarettes    Start date: 05/23/1973    Quit date: 11/21/1973    Years since quitting: 49.3   Smokeless tobacco: Never  Vaping Use   Vaping status: Never Used  Substance and Sexual Activity   Alcohol use: No   Drug use: Never   Sexual activity: Not Currently  Other Topics Concern   Not on file  Social History Narrative   2 sons live with her  Social Determinants of Health   Financial Resource Strain: Low Risk  (10/29/2022)   Overall Financial Resource Strain (CARDIA)    Difficulty of Paying Living Expenses: Not hard at all  Food Insecurity: No Food Insecurity (10/29/2022)   Hunger Vital Sign    Worried About Running Out of Food in the Last Year: Never true    Ran Out of Food in the Last Year: Never true  Transportation Needs: No Transportation Needs (10/27/2021)   PRAPARE - Administrator, Civil Service (Medical): No    Lack of Transportation (Non-Medical): No  Physical Activity: Sufficiently Active (10/29/2022)   Exercise Vital Sign    Days of Exercise per Week: 5 days    Minutes of Exercise per Session: 30 min  Stress: No Stress Concern Present (10/29/2022)   [employer] of  Occupational Health - Occupational Stress Questionnaire    Feeling of Stress : Not at all  Social Connections: Socially Isolated (10/29/2022)   Social Connection and Isolation Panel [NHANES]    Frequency of Communication with Friends and Family: More than three times a week    Frequency of Social Gatherings with Friends and Family: More than three times a week    Attends Religious Services: Never    Database administrator or Organizations: No    Attends Banker Meetings: Never    Marital Status: Divorced  Catering manager Violence: Not At Risk (10/29/2022)   Humiliation, Afraid, Rape, and Kick questionnaire    Fear of Current or Ex-Partner: No    Emotionally Abused: No    Physically Abused: No    Sexually Abused: No   Family Status  Relation Name Status   Mother  Deceased   Father  Deceased   Sister  Alive   Brother  Alive   Son  Alive   MGM  Deceased   MGF  Deceased   PGM  Deceased   PGF  Deceased   Sister  Alive   Sister  Alive   Son  Alive  No partnership data on file   Family History  Problem Relation Age of Onset   Diabetes Mother    Heart attack Father    Heart disease Father    Diabetes Sister    Celiac disease Son    Arthritis Sister    Allergies  Allergen Reactions   Sulfa Antibiotics Rash      Patient Care Team: Mechele Claude, MD as PCP - General (Family Medicine)   Outpatient Medications Prior to Visit  Medication Sig   aspirin 81 MG tablet Take 81 mg by mouth daily.   diclofenac sodium (VOLTAREN) 1 % GEL Apply 4 g topically 4 (four) times daily. (knee pain). Ok to give OTC version if not covered by insurance   fluticasone (CUTIVATE) 0.05 % cream APPLY TOPICALLY TO AFFECTED AREA TWICE DAILY   ketoconazole (NIZORAL) 2 % cream APPLY TOPICALLY TO AFFECTED AREA ONCE DAILY   levothyroxine (SYNTHROID) 50 MCG tablet Take 1 tablet (50 mcg total) by mouth daily.   lisinopril (ZESTRIL) 40 MG tablet Take 1 tablet (40 mg total) by mouth daily.    No facility-administered medications prior to visit.    Review of Systems  Constitutional:  Negative for chills, diaphoresis and fever.  HENT:  Negative for ear pain, sinus pain and sore throat.   Eyes:  Negative for blurred vision, double vision and redness.  Respiratory:  Negative for cough, hemoptysis and shortness of breath.   Cardiovascular:  Negative for chest pain and PND.  Gastrointestinal:  Negative for blood in stool, melena, nausea and vomiting.  Genitourinary:  Negative for dysuria, flank pain and hematuria.  Musculoskeletal:  Negative for falls, myalgias and neck pain.  Skin:  Negative for itching and rash.  Neurological:  Negative for dizziness, weakness and headaches.  Psychiatric/Behavioral:  Negative for hallucinations and suicidal ideas. The patient does not have insomnia.    Negative unless indicated in HPI    Objective:     BP 139/80   Pulse 68   Temp 98.3 F (36.8 C) (Temporal)   Ht 5\' 5"  (1.651 m)   Wt 188 lb 12.8 oz (85.6 kg)   SpO2 93%   BMI 31.42 kg/m  BP Readings from Last 3 Encounters:  03/17/23 139/80  02/25/23 129/78  08/26/22 139/79   Wt Readings from Last 3 Encounters:  03/17/23 188 lb 12.8 oz (85.6 kg)  02/25/23 182 lb 9.6 oz (82.8 kg)  10/29/22 180 lb (81.6 kg)      Physical Exam Vitals and nursing note reviewed. Exam conducted with a chaperone present Dahlia [employer]).  Constitutional:      General: She is not in acute distress.    Appearance: Normal appearance. She is not ill-appearing.  HENT:     Head: Normocephalic and atraumatic.  Eyes:     Extraocular Movements: Extraocular movements intact.     Conjunctiva/sclera: Conjunctivae normal.     Pupils: Pupils are equal, round, and reactive to light.  Neck:     Vascular: No carotid bruit.  Cardiovascular:     Rate and Rhythm: Normal rate and regular rhythm.  Pulmonary:     Effort: Pulmonary effort is normal.     Breath sounds: Normal breath sounds.  Abdominal:      General: Bowel sounds are normal.     Palpations: Abdomen is soft.     Hernia: A hernia is present. There is no hernia in the left inguinal area or right inguinal area.  Genitourinary:    General: Normal vulva.     Exam position: Supine.     Pubic Area: No rash or pubic lice.      Labia:        Right: No rash or tenderness.        Left: No tenderness.      Urethra: No prolapse, urethral pain, urethral swelling or urethral lesion.     Vagina: Normal. No foreign body. No vaginal discharge, erythema, tenderness or bleeding.     Cervix: Normal. No discharge or cervical bleeding.     Uterus: Normal. Not tender and no uterine prolapse.      Adnexa: Right adnexa normal and left adnexa normal.       Right: No tenderness.         Left: No tenderness.    Musculoskeletal:        General: Normal range of motion.     Cervical back: Normal range of motion and neck supple. No rigidity or tenderness.     Right lower leg: No edema.     Left lower leg: No edema.  Lymphadenopathy:     Cervical: No cervical adenopathy.     Lower Body: No right inguinal adenopathy. No left inguinal adenopathy.  Skin:    General: Skin is warm and dry.     Coloration: Skin is not jaundiced.     Findings: No rash.  Neurological:     Mental Status: She is alert and oriented to person,  place, and time. Mental status is at baseline.  Psychiatric:        Mood and Affect: Mood normal.        Behavior: Behavior normal.        Thought Content: Thought content normal.        Judgment: Judgment normal.      No results found for any visits on 03/17/23. Last CBC Lab Results  Component Value Date   WBC 5.4 02/25/2023   HGB 13.1 02/25/2023   HCT 40.5 02/25/2023   MCV 86 02/25/2023   MCH 27.9 02/25/2023   RDW 13.4 02/25/2023   PLT 232 02/25/2023   Last metabolic panel Lab Results  Component Value Date   GLUCOSE 110 (H) 02/25/2023   NA 140 02/25/2023   K 4.5 02/25/2023   CL 103 02/25/2023   CO2 23 02/25/2023    BUN 17 02/25/2023   CREATININE 0.98 02/25/2023   EGFR 60 02/25/2023   CALCIUM 9.4 02/25/2023   PROT 6.6 02/25/2023   ALBUMIN 4.2 02/25/2023   LABGLOB 2.4 02/25/2023   AGRATIO 1.4 08/26/2022   BILITOT 0.5 02/25/2023   ALKPHOS 129 (H) 02/25/2023   AST 15 02/25/2023   ALT 10 02/25/2023   Last lipids Lab Results  Component Value Date   CHOL 175 02/25/2023   HDL 47 02/25/2023   LDLCALC 118 (H) 02/25/2023   TRIG 52 02/25/2023   CHOLHDL 3.7 02/25/2023   Last hemoglobin A1c No results found for: "HGBA1C" Last thyroid functions Lab Results  Component Value Date   TSH 2.940 02/25/2023   T4TOTAL 7.9 02/20/2022        Assessment & Plan:    Routine Health Maintenance and Physical Exam   Routine medical exam -     Ct Ng M genitalium NAA, Urine  Women's annual routine gynecological examination -     Cytology - PAP  Acquired hypothyroidism  BMI 32.0-32.9,adult  Essential hypertension   Diagnosis and orders addressed:   1. Annual physical exam   2. Essential hypertension Low sodium diet No labs needed last labs 02/25/2023   3. Acquired hypothyroidism - Thyroid Panel With TSH normal from 02/25/23   4. BMI 32.0-32.9,adult Discussed diet and exercise for person with BMI >25 Will recheck weight in 3-6 months   5. Gynecologic exam normal - Cytology - PAP   Health Maintenance reviewed Diet and exercise encouraged Discussed health benefits of physical activity, and encouraged her to engage in regular exercise appropriate for her age and condition.  Need to make appointment to see PCP for surgical clearance for cataracts surgery Follow-ups on 03/22/2023 or surgical clearance.     Arrie Aran Santa Lighter, DNP [facility] [address] [place], [place] [address] [phone]

## 2023-03-22 ENCOUNTER — Ambulatory Visit (INDEPENDENT_AMBULATORY_CARE_PROVIDER_SITE_OTHER): Payer: Medicare HMO | Admitting: Family Medicine

## 2023-03-22 ENCOUNTER — Encounter: Payer: Self-pay | Admitting: Family Medicine

## 2023-03-22 VITALS — BP 143/71 | HR 73 | Temp 97.0°F | Ht 65.0 in | Wt 186.6 lb

## 2023-03-22 DIAGNOSIS — Z01818 Encounter for other preprocedural examination: Secondary | ICD-10-CM

## 2023-03-22 DIAGNOSIS — R010 Benign and innocent cardiac murmurs: Secondary | ICD-10-CM | POA: Diagnosis not present

## 2023-03-22 NOTE — Progress Notes (Signed)
Subjective:  Patient ID: Meredith Ruiz, female    DOB: [birth year]  Age: 77 y.o. MRN: [id number]  CC: Surgical Clearance (Murmur recently found/)   HPI ERANDY PHILBERT presents for concern for newly discovered murmur. Noted by PA at practice of Georges Mouse, [facility], ophthalmology for Cataract. Scheduled for 8/14. No chest pain or dyspnea. No syncope, near syncope.      03/22/2023    9:15 AM 02/25/2023    8:46 AM 10/29/2022    9:30 AM  Depression screen PHQ 2/9  Decreased Interest 0 0 0  Down, Depressed, Hopeless 0 0 0  PHQ - 2 Score 0 0 0    History Meredith Ruiz has a past medical history of Closed fracture distal radius and ulna, left, with routine healing, subsequent encounter 01/19/22 (03/03/2022), Hypertension, and Thyroid disease.   She has a past surgical history that includes Cesarean section.   Her family history includes Arthritis in her sister; Celiac disease in her son; Diabetes in her mother and sister; Heart attack in her father; Heart disease in her father.She reports that she quit smoking about 49 years ago. Her smoking use included cigarettes. She started smoking about 49 years ago. She has a 0.1 pack-year smoking history. She has never used smokeless tobacco. She reports that she does not drink alcohol and does not use drugs.    ROS Review of Systems  Constitutional: Negative.   HENT:  Negative for congestion.   Eyes:  Negative for visual disturbance.  Respiratory:  Negative for shortness of breath.   Cardiovascular:  Negative for chest pain.  Gastrointestinal:  Negative for abdominal pain, constipation, diarrhea, nausea and vomiting.  Genitourinary:  Negative for difficulty urinating.  Musculoskeletal:  Negative for arthralgias and myalgias.  Neurological:  Negative for headaches.  Psychiatric/Behavioral:  Negative for sleep disturbance.     Objective:  BP (!) 143/71   Pulse 73   Temp (!) 97 F (36.1 C)   Ht 5\' 5"  (1.651 m)   Wt 186 lb  9.6 oz (84.6 kg)   SpO2 95%   BMI 31.05 kg/m   BP Readings from Last 3 Encounters:  03/22/23 (!) 143/71  03/17/23 139/80  02/25/23 129/78    Wt Readings from Last 3 Encounters:  03/22/23 186 lb 9.6 oz (84.6 kg)  03/17/23 188 lb 12.8 oz (85.6 kg)  02/25/23 182 lb 9.6 oz (82.8 kg)     Physical Exam Constitutional:      General: She is not in acute distress.    Appearance: She is well-developed.  HENT:     Head: Normocephalic and atraumatic.  Eyes:     Conjunctiva/sclera: Conjunctivae normal.     Pupils: Pupils are equal, round, and reactive to light.  Neck:     Thyroid: No thyromegaly.  Cardiovascular:     Rate and Rhythm: Normal rate and regular rhythm.     Heart sounds: Murmur (soft 1/6 LSB midsystolic) heard.  Pulmonary:     Effort: Pulmonary effort is normal. No respiratory distress.     Breath sounds: Normal breath sounds. No wheezing or rales.  Abdominal:     General: Bowel sounds are normal. There is no distension.     Palpations: Abdomen is soft.     Tenderness: There is no abdominal tenderness.  Musculoskeletal:        General: Normal range of motion.     Cervical back: Normal range of motion and neck supple.  Lymphadenopathy:     Cervical:  No cervical adenopathy.  Skin:    General: Skin is warm and dry.  Neurological:     Mental Status: She is alert and oriented to person, place, and time.  Psychiatric:        Behavior: Behavior normal.        Thought Content: Thought content normal.        Judgment: Judgment normal.    EKG: NS ST changes that are consistent with EKG of 05/01/2020. No ischemic changes    Assessment & Plan:   Landrey was seen today for surgical clearance.  Diagnoses and all orders for this visit:  Preop examination -     EKG 12-Lead  Benign cardiac murmur -     Ambulatory referral to Cardiology    Pt. Should do well with very low risk for the cataract extraction procedure.   I am having [name] maintain her  aspirin, diclofenac sodium, fluticasone, ketoconazole, levothyroxine, and lisinopril.  Allergies as of 03/22/2023       Reactions   Sulfa Antibiotics Rash        Medication List        Accurate as of March 22, 2023  8:21 PM. If you have any questions, ask your nurse or doctor.          aspirin 81 MG tablet Take 81 mg by mouth daily.   diclofenac sodium 1 % Gel Commonly known as: VOLTAREN Apply 4 g topically 4 (four) times daily. (knee pain). Ok to give OTC version if not covered by insurance   fluticasone 0.05 % cream Commonly known as: CUTIVATE APPLY TOPICALLY TO AFFECTED AREA TWICE DAILY   ketoconazole 2 % cream Commonly known as: NIZORAL APPLY TOPICALLY TO AFFECTED AREA ONCE DAILY   levothyroxine 50 MCG tablet Commonly known as: SYNTHROID Take 1 tablet (50 mcg total) by mouth daily.   lisinopril 40 MG tablet Commonly known as: ZESTRIL Take 1 tablet (40 mg total) by mouth daily.         Follow-up: Return in about 6 months (around 09/22/2023) for hypertension, Hypothyroidism.  Meredith Ruiz, M.D.

## 2023-03-26 ENCOUNTER — Other Ambulatory Visit: Payer: Self-pay

## 2023-03-26 DIAGNOSIS — Z780 Asymptomatic menopausal state: Secondary | ICD-10-CM

## 2023-03-26 DIAGNOSIS — Z1231 Encounter for screening mammogram for malignant neoplasm of breast: Secondary | ICD-10-CM

## 2023-03-31 DIAGNOSIS — H25812 Combined forms of age-related cataract, left eye: Secondary | ICD-10-CM | POA: Diagnosis not present

## 2023-04-14 ENCOUNTER — Ambulatory Visit
Admission: RE | Admit: 2023-04-14 | Discharge: 2023-04-14 | Disposition: A | Discharge status: Home / Self Care | Payer: Medicare HMO | Source: Ambulatory Visit | Attending: Family Medicine | Admitting: Family Medicine

## 2023-04-14 DIAGNOSIS — Z1231 Encounter for screening mammogram for malignant neoplasm of breast: Secondary | ICD-10-CM | POA: Diagnosis not present

## 2023-04-21 DIAGNOSIS — H25811 Combined forms of age-related cataract, right eye: Secondary | ICD-10-CM | POA: Diagnosis not present

## 2023-04-28 DIAGNOSIS — H04123 Dry eye syndrome of bilateral lacrimal glands: Secondary | ICD-10-CM | POA: Diagnosis not present

## 2023-04-28 DIAGNOSIS — H524 Presbyopia: Secondary | ICD-10-CM | POA: Diagnosis not present

## 2023-06-09 ENCOUNTER — Ambulatory Visit: Payer: Medicare HMO | Admitting: Cardiology

## 2023-06-28 ENCOUNTER — Other Ambulatory Visit: Payer: Self-pay | Admitting: Family Medicine

## 2023-06-28 DIAGNOSIS — B372 Candidiasis of skin and nail: Secondary | ICD-10-CM

## 2023-06-28 NOTE — Telephone Encounter (Signed)
Last office visit 03/22/23  Fluticasone Last refill 02/08/23, 30 grams, no refill  Ketoconazole Last refill 02/08/23, 15 grams, no refill

## 2023-08-19 ENCOUNTER — Telehealth: Payer: Self-pay | Admitting: Family Medicine

## 2023-08-26 ENCOUNTER — Other Ambulatory Visit: Payer: Medicare HMO

## 2023-08-26 ENCOUNTER — Ambulatory Visit: Payer: Medicare HMO | Admitting: Family Medicine

## 2023-08-30 ENCOUNTER — Ambulatory Visit: Payer: Medicare HMO | Admitting: Family Medicine

## 2023-09-20 ENCOUNTER — Ambulatory Visit: Payer: Medicare HMO | Admitting: Family Medicine

## 2023-09-20 ENCOUNTER — Other Ambulatory Visit: Payer: Self-pay | Admitting: Family Medicine

## 2023-09-20 DIAGNOSIS — B372 Candidiasis of skin and nail: Secondary | ICD-10-CM

## 2023-09-27 ENCOUNTER — Encounter: Payer: Self-pay | Admitting: Family Medicine

## 2023-09-27 ENCOUNTER — Ambulatory Visit: Payer: Medicare HMO | Admitting: Family Medicine

## 2023-09-27 VITALS — BP 144/75 | HR 73 | Temp 97.5°F | Wt 192.0 lb

## 2023-09-27 DIAGNOSIS — E039 Hypothyroidism, unspecified: Secondary | ICD-10-CM

## 2023-09-30 LAB — CMP14+EGFR
ALT: 10 [IU]/L (ref 0–32)
AST: 11 [IU]/L (ref 0–40)
Albumin: 4.1 g/dL (ref 3.8–4.8)
Alkaline Phosphatase: 152 [IU]/L — ABNORMAL HIGH (ref 44–121)
BUN/Creatinine Ratio: 20 (ref 12–28)
BUN: 21 mg/dL (ref 8–27)
Bilirubin Total: 0.4 mg/dL (ref 0.0–1.2)
CO2: 23 mmol/L (ref 20–29)
Calcium: 9.4 mg/dL (ref 8.7–10.3)
Chloride: 104 mmol/L (ref 96–106)
Creatinine, Ser: 1.06 mg/dL — ABNORMAL HIGH (ref 0.57–1.00)
Globulin, Total: 2.4 g/dL (ref 1.5–4.5)
Glucose: 116 mg/dL — ABNORMAL HIGH (ref 70–99)
Potassium: 3.9 mmol/L (ref 3.5–5.2)
Sodium: 141 mmol/L (ref 134–144)
Total Protein: 6.5 g/dL (ref 6.0–8.5)
eGFR: 54 mL/min/{1.73_m2} — ABNORMAL LOW (ref >59)

## 2023-09-30 LAB — THYROID PANEL WITH TSH
Free Thyroxine Index: 2.5 (ref 1.2–4.9)
T3 Uptake Ratio: 32 % (ref 24–39)
T4, Total: 7.8 ug/dL (ref 4.5–12.0)
TSH: 3.4 u[IU]/mL (ref 0.450–4.500)

## 2023-09-30 LAB — T4, FREE: Free T4: 1.34 ng/dL (ref 0.82–1.77)

## 2023-10-01 ENCOUNTER — Encounter: Payer: Self-pay | Admitting: Family Medicine

## 2023-10-01 NOTE — Progress Notes (Signed)
Subjective:  Patient ID: Meredith Ruiz, female    DOB: [birth year]  Age: 78 y.o. MRN: [id number]  CC: Medical Management of Chronic Issues   HPI Meredith Ruiz presents for  follow-up on  thyroid. The patient has a history of hypothyroidism for many years. It has been stable recently. Pt. denies any change in  voice, loss of hair, heat or cold intolerance. Energy level has been adequate to good. Patient denies constipation and diarrhea. No myxedema. Medication is as noted below. Verified that pt is taking it daily on an empty stomach. Well tolerated.      03/22/2023    9:15 AM 02/25/2023    8:46 AM 10/29/2022    9:30 AM  Depression screen PHQ 2/9  Decreased Interest 0 0 0  Down, Depressed, Hopeless 0 0 0  PHQ - 2 Score 0 0 0    History Meredith Ruiz has a past medical history of Closed fracture distal radius and ulna, left, with routine healing, subsequent encounter 01/19/22 (03/03/2022), Hypertension, and Thyroid disease.   She has a past surgical history that includes Cesarean section.   Her family history includes Arthritis in her sister; Celiac disease in her son; Diabetes in her mother and sister; Heart attack in her father; Heart disease in her father.She reports that she quit smoking about 49 years ago. Her smoking use included cigarettes. She started smoking about 50 years ago. She has a 0.1 pack-year smoking history. She has never used smokeless tobacco. She reports that she does not drink alcohol and does not use drugs.    ROS Review of Systems  Constitutional: Negative.   HENT: Negative.    Eyes:  Negative for visual disturbance.  Respiratory:  Negative for shortness of breath.   Cardiovascular:  Negative for chest pain.  Gastrointestinal:  Negative for abdominal pain.  Musculoskeletal:  Negative for arthralgias.    Objective:  BP (!) 144/75   Pulse 73   Temp (!) 97.5 F (36.4 C)   Wt 192 lb (87.1 kg)   SpO2 94%   BMI 31.95 kg/m   BP Readings from Last 3  Encounters:  09/27/23 (!) 144/75  03/22/23 (!) 143/71  03/17/23 139/80    Wt Readings from Last 3 Encounters:  09/27/23 192 lb (87.1 kg)  03/22/23 186 lb 9.6 oz (84.6 kg)  03/17/23 188 lb 12.8 oz (85.6 kg)     Physical Exam Constitutional:      General: She is not in acute distress.    Appearance: She is well-developed.  Cardiovascular:     Rate and Rhythm: Normal rate and regular rhythm.  Pulmonary:     Breath sounds: Normal breath sounds.  Musculoskeletal:        General: Normal range of motion.  Skin:    General: Skin is warm and Ruiz.  Neurological:     Mental Status: She is alert and oriented to person, place, and time.      Results for orders placed or performed in visit on 09/27/23  T4, free   Collection Time: 09/27/23  9:27 AM  Result Value Ref Range   Free T4 1.34 0.82 - 1.77 ng/dL  Thyroid Panel With TSH   Collection Time: 09/27/23  9:27 AM  Result Value Ref Range   TSH 3.400 0.450 - 4.500 uIU/mL   T4, Total 7.8 4.5 - 12.0 ug/dL   T3 Uptake Ratio 32 24 - 39 %   Free Thyroxine Index 2.5 1.2 - 4.9  CMP14+EGFR  Collection Time: 09/27/23  9:27 AM  Result Value Ref Range   Glucose 116 (H) 70 - 99 mg/dL   BUN 21 8 - 27 mg/dL   Creatinine, Ser 1.61 (H) 0.57 - 1.00 mg/dL   eGFR 54 (L) >09 UE/AVW/0.98   BUN/Creatinine Ratio 20 12 - 28   Sodium 141 134 - 144 mmol/L   Potassium 3.9 3.5 - 5.2 mmol/L   Chloride 104 96 - 106 mmol/L   CO2 23 20 - 29 mmol/L   Calcium 9.4 8.7 - 10.3 mg/dL   Total Protein 6.5 6.0 - 8.5 g/dL   Albumin 4.1 3.8 - 4.8 g/dL   Globulin, Total 2.4 1.5 - 4.5 g/dL   Bilirubin Total 0.4 0.0 - 1.2 mg/dL   Alkaline Phosphatase 152 (H) 44 - 121 IU/L   AST 11 0 - 40 IU/L   ALT 10 0 - 32 IU/L    Assessment & Plan:   Meredith Ruiz was seen today for medical management of chronic issues.  Diagnoses and all orders for this visit:  Acquired hypothyroidism -     T4, free -     Thyroid Panel With TSH -     CMP14+EGFR       I am having  Meredith Ruiz maintain her aspirin, diclofenac sodium, levothyroxine, lisinopril, fluticasone, and ketoconazole.  Allergies as of 09/27/2023       Reactions   Sulfa Antibiotics Rash        Medication List        Accurate as of September 27, 2023 11:59 PM. If you have any questions, ask your nurse or doctor.          aspirin 81 MG tablet Take 81 mg by mouth daily.   diclofenac sodium 1 % Gel Commonly known as: VOLTAREN Apply 4 g topically 4 (four) times daily. (knee pain). Ok to give OTC version if not covered by insurance   fluticasone 0.05 % cream Commonly known as: CUTIVATE APPLY  CREAM TOPICALLY TO AFFECTED AREA TWICE DAILY   ketoconazole 2 % cream Commonly known as: NIZORAL APPLY  CREAM TOPICALLY TO AFFECTED AREA TWICE DAILY   levothyroxine 50 MCG tablet Commonly known as: SYNTHROID Take 1 tablet (50 mcg total) by mouth daily.   lisinopril 40 MG tablet Commonly known as: ZESTRIL Take 1 tablet (40 mg total) by mouth daily.         Follow-up: Return in about 6 months (around 03/26/2024).  Meredith Ruiz, M.D.

## 2023-11-04 ENCOUNTER — Ambulatory Visit: Payer: Medicare HMO

## 2023-11-04 VITALS — Ht 65.0 in | Wt 192.0 lb

## 2023-11-04 DIAGNOSIS — Z0000 Encounter for general adult medical examination without abnormal findings: Secondary | ICD-10-CM

## 2023-11-04 NOTE — Patient Instructions (Signed)
 [name] , Thank you for taking time to come for your Medicare Wellness Visit. I appreciate your ongoing commitment to your health goals. Please review the following plan we discussed and let me know if I can assist you in the future.   Referrals/Orders/Follow-Ups/Clinician Recommendations: Aim for 30 minutes of exercise or brisk walking, 6-8 glasses of water, and 5 servings of fruits and vegetables each day.  This is a list of the screening recommended for you and due dates:  Health Maintenance  Topic Date Due   COVID-19 Vaccine (5 - 2024-25 season) 04/18/2023   Flu Shot  11/15/2023*   Zoster (Shingles) Vaccine (1 of 2) 12/25/2023*   Pneumonia Vaccine (1 of 1 - PCV) 09/26/2024*   DEXA scan (bone density measurement)  09/26/2024*   Mammogram  04/13/2024   Medicare Annual Wellness Visit  11/03/2024   DTaP/Tdap/Td vaccine (2 - Td or Tdap) 07/13/2026   Hepatitis C Screening  Completed   HPV Vaccine  Aged Out  *Topic was postponed. The date shown is not the original due date.    Advanced directives: (ACP Link)Information on Advanced Care Planning can be found at [facility] [place] Advance Health Care Directives Advance Health Care Directives. [email or web address]   Next Medicare Annual Wellness Visit scheduled for next year: Yes

## 2023-11-04 NOTE — Progress Notes (Signed)
 Subjective:   Meredith Ruiz is a 78 y.o. who presents for a Medicare Wellness preventive visit.  Visit Complete: Virtual I connected with  Meredith Ruiz on 11/04/23 by a audio enabled telemedicine application and verified that I am speaking with the correct person using two identifiers.  Patient Location: Home  Provider Location: Home Office  I discussed the limitations of evaluation and management by telemedicine. The patient expressed understanding and agreed to proceed.  Vital Signs: Because this visit was a virtual/telehealth visit, some criteria may be missing or patient reported. Any vitals not documented were not able to be obtained and vitals that have been documented are patient reported.  VideoDeclined- This patient declined Librarian, academic. Therefore the visit was completed with audio only.  Persons Participating in Visit: Patient.  AWV Questionnaire: No: Patient Medicare AWV questionnaire was not completed prior to this visit.  Cardiac Risk Factors include: advanced age (>59men, >33 women);hypertension     Objective:    Today's Vitals   11/04/23 1230  Weight: 192 lb (87.1 kg)  Height: 5\' 5"  (1.651 m)   Body mass index is 31.95 kg/m.     11/04/2023   12:37 PM 10/29/2022    9:31 AM 10/27/2021    9:53 AM 10/24/2020    9:58 AM 10/17/2019   10:28 AM 09/03/2016    5:30 PM  Advanced Directives  Does Patient Have a Medical Advance Directive? No No No No No No  Would patient like information on creating a medical advance directive? Yes (MAU/Ambulatory/Procedural Areas - Information given) No - Patient declined No - Patient declined No - Patient declined No - Patient declined No - Patient declined    Current Medications (verified) Outpatient Encounter Medications as of 11/04/2023  Medication Sig   aspirin 81 MG tablet Take 81 mg by mouth daily.   diclofenac sodium (VOLTAREN) 1 % GEL Apply 4 g topically 4 (four) times daily. (knee  pain). Ok to give OTC version if not covered by insurance   fluticasone (CUTIVATE) 0.05 % cream APPLY  CREAM TOPICALLY TO AFFECTED AREA TWICE DAILY   ketoconazole (NIZORAL) 2 % cream APPLY  CREAM TOPICALLY TO AFFECTED AREA TWICE DAILY   levothyroxine (SYNTHROID) 50 MCG tablet Take 1 tablet (50 mcg total) by mouth daily.   lisinopril (ZESTRIL) 40 MG tablet Take 1 tablet (40 mg total) by mouth daily.   No facility-administered encounter medications on file as of 11/04/2023.    Allergies (verified) Sulfa antibiotics   History: Past Medical History:  Diagnosis Date   Closed fracture distal radius and ulna, left, with routine healing, subsequent encounter 01/19/22 03/03/2022   Hypertension    Thyroid disease    Past Surgical History:  Procedure Laterality Date   CESAREAN SECTION     Family History  Problem Relation Age of Onset   Diabetes Mother    Heart attack Father    Heart disease Father    Diabetes Sister    Celiac disease Son    Arthritis Sister    Social History   Socioeconomic History   Marital status: Legally Separated    Spouse name: Not on file   Number of children: 2   Years of education: 9   Highest education level: 9th grade  Occupational History   Occupation: Retired  Tobacco Use   Smoking status: Former    Current packs/day: 0.00    Average packs/day: 0.3 packs/day for 0.5 years (0.1 ttl pk-yrs)    Types:  Cigarettes    Start date: 05/23/1973    Quit date: 11/21/1973    Years since quitting: 49.9   Smokeless tobacco: Never  Vaping Use   Vaping status: Never Used  Substance and Sexual Activity   Alcohol use: No   Drug use: Never   Sexual activity: Not Currently  Other Topics Concern   Not on file  Social History Narrative   2 sons live with her   Social Drivers of Corporate investment banker Strain: Low Risk  (11/04/2023)   Overall Financial Resource Strain (CARDIA)    Difficulty of Paying Living Expenses: Not hard at all  Food Insecurity: No Food  Insecurity (11/04/2023)   Hunger Vital Sign    Worried About Running Out of Food in the Last Year: Never true    Ran Out of Food in the Last Year: Never true  Transportation Needs: No Transportation Needs (11/04/2023)   PRAPARE - Administrator, Civil Service (Medical): No    Lack of Transportation (Non-Medical): No  Physical Activity: Sufficiently Active (11/04/2023)   Exercise Vital Sign    Days of Exercise per Week: 5 days    Minutes of Exercise per Session: 30 min  Stress: No Stress Concern Present (11/04/2023)   [employer] of Occupational Health - Occupational Stress Questionnaire    Feeling of Stress : Not at all  Social Connections: Socially Isolated (11/04/2023)   Social Connection and Isolation Panel [NHANES]    Frequency of Communication with Friends and Family: More than three times a week    Frequency of Social Gatherings with Friends and Family: Three times a week    Attends Religious Services: Never    Active Member of Clubs or Organizations: No    Attends Engineer, structural: Never    Marital Status: Divorced    Tobacco Counseling Counseling given: Not Answered    Clinical Intake:  Pre-visit preparation completed: Yes  Pain : No/denies pain     Diabetes: No  No results found for: "HGBA1C"   How often do you need to have someone help you when you read instructions, pamphlets, or other written materials from your doctor or pharmacy?: 1 - Never  Interpreter Needed?: No  Information entered by :: Kandis Fantasia LPN   Activities of Daily Living     11/04/2023   12:37 PM  In your present state of health, do you have any difficulty performing the following activities:  Hearing? 0  Vision? 0  Difficulty concentrating or making decisions? 0  Walking or climbing stairs? 0  Dressing or bathing? 0  Doing errands, shopping? 0  Preparing Food and eating ? N  Using the Toilet? N  In the past six months, have you accidently leaked  urine? N  Do you have problems with loss of bowel control? N  Managing your Medications? N  Managing your Finances? N  Housekeeping or managing your Housekeeping? N    Patient Care Team: Mechele Claude, MD as PCP - General (Family Medicine) Conley Rolls, [facility] [place], [place] as Referring Physician (Optometry)  Indicate any recent Medical Services you may have received from other than [facility] providers in the past year (date may be approximate).     Assessment:   This is a routine wellness examination for [place].  Hearing/Vision screen Hearing Screening - Comments:: Denies hearing difficulties   Vision Screening - Comments:: Wears rx glasses - up to date with routine eye exams with Dr. Conley Rolls    Goals Addressed  This Visit's Progress    Patient Stated   On track    10/17/2019 AWV Goal: Exercise for General Health  Patient will verbalize understanding of the benefits of increased physical activity: Exercising regularly is important. It will improve your overall fitness, flexibility, and endurance. Regular exercise also will improve your overall health. It can help you control your weight, reduce stress, and improve your bone density. Over the next year, patient will increase physical activity as tolerated with a goal of at least 150 minutes of moderate physical activity per week.  You can tell that you are exercising at a moderate intensity if your heart starts beating faster and you start breathing faster but can still hold a conversation. Moderate-intensity exercise ideas include: Walking 1 mile (1.6 km) in about 15 minutes Biking Hiking Golfing Dancing Water aerobics Patient will verbalize understanding of everyday activities that increase physical activity by providing examples like the following: Yard work, such as: Insurance underwriter Gardening Washing windows or floors Patient will be able to  explain general safety guidelines for exercising:  Before you start a new exercise program, talk with your health care provider. Do not exercise so much that you hurt yourself, feel dizzy, or get very short of breath. Wear comfortable clothes and wear shoes with good support. Drink plenty of water while you exercise to prevent dehydration or heat stroke. Work out until your breathing and your heartbeat get faster.       COMPLETED: Patient Stated       10/24/2020 AWV Goal: Exercise for General Health  Patient will verbalize understanding of the benefits of increased physical activity: Exercising regularly is important. It will improve your overall fitness, flexibility, and endurance. Regular exercise also will improve your overall health. It can help you control your weight, reduce stress, and improve your bone density. Over the next year, patient will increase physical activity as tolerated with a goal of at least 150 minutes of moderate physical activity per week.  You can tell that you are exercising at a moderate intensity if your heart starts beating faster and you start breathing faster but can still hold a conversation. Moderate-intensity exercise ideas include: Walking 1 mile (1.6 km) in about 15 minutes Biking Hiking Golfing Dancing Water aerobics Patient will verbalize understanding of everyday activities that increase physical activity by providing examples like the following: Yard work, such as: Insurance underwriter Gardening Washing windows or floors Patient will be able to explain general safety guidelines for exercising:  Before you start a new exercise program, talk with your health care provider. Do not exercise so much that you hurt yourself, feel dizzy, or get very short of breath. Wear comfortable clothes and wear shoes with good support. Drink plenty of water while you exercise to  prevent dehydration or heat stroke. Work out until your breathing and your heartbeat get faster.      Remain active and independent         Depression Screen     11/04/2023   12:36 PM 03/22/2023    9:15 AM 03/17/2023    9:09 AM 02/25/2023    8:46 AM 10/29/2022    9:30 AM 08/26/2022    2:44 PM 08/26/2022    2:40 PM  PHQ 2/9 Scores  PHQ - 2 Score 0 0  0 0 0 0  Exception  Documentation   Patient refusal        Fall Risk    11/04/2023   12:37 PM 03/22/2023    9:15 AM 02/25/2023    8:46 AM 10/29/2022    9:29 AM 08/26/2022    2:43 PM  Fall Risk   Falls in the past year? 0 0 0 0 1  Number falls in past yr: 0   0 0  Injury with Fall? 0   0 1  Risk for fall due to : No Fall Risks   No Fall Risks History of fall(s)  Follow up Falls prevention discussed;Education provided;Falls evaluation completed   Falls prevention discussed Falls evaluation completed    MEDICARE RISK AT HOME:  Medicare Risk at Home Any stairs in or around the home?: No If so, are there any without handrails?: No Home free of loose throw rugs in walkways, pet beds, electrical cords, etc?: Yes Adequate lighting in your home to reduce risk of falls?: Yes Life alert?: No Use of a cane, walker or w/c?: No Grab bars in the bathroom?: Yes Shower chair or bench in shower?: No Elevated toilet seat or a handicapped toilet?: Yes  TIMED UP AND GO:  Was the test performed?  No  Cognitive Function: 6CIT completed        11/04/2023   12:37 PM 10/29/2022    9:31 AM 10/24/2020    9:59 AM 10/17/2019   10:31 AM  6CIT Screen  What Year? 0 points 0 points 0 points 0 points  What month? 0 points 0 points 0 points 0 points  What time? 0 points 0 points 0 points 0 points  Count back from 20 0 points 0 points 0 points 0 points  Months in reverse 0 points 0 points 0 points 0 points  Repeat phrase 0 points 0 points 0 points 2 points  Total Score 0 points 0 points 0 points 2 points    Immunizations Immunization History   Administered Date(s) Administered   PFIZER(Purple Top)SARS-COV-2 Vaccination 10/13/2019, 11/10/2019, 06/21/2020, 12/09/2020   Tdap 07/13/2016    Screening Tests Health Maintenance  Topic Date Due   COVID-19 Vaccine (5 - 2024-25 season) 04/18/2023   INFLUENZA VACCINE  11/15/2023 (Originally 03/18/2023)   Zoster Vaccines- Shingrix (1 of 2) 12/25/2023 (Originally 12/21/1996)   Pneumonia Vaccine 56+ Years old (1 of 1 - PCV) 09/26/2024 (Originally 12/22/2011)   DEXA SCAN  09/26/2024 (Originally 12/22/2011)   MAMMOGRAM  04/13/2024   Medicare Annual Wellness (AWV)  11/03/2024   DTaP/Tdap/Td (2 - Td or Tdap) 07/13/2026   Hepatitis C Screening  Completed   HPV VACCINES  Aged Out    Health Maintenance  Health Maintenance Due  Topic Date Due   COVID-19 Vaccine (5 - 2024-25 season) 04/18/2023   Health Maintenance Items Addressed: Patient declines vaccines  ; dexa ordered   Additional Screening:  Vision Screening: Recommended annual ophthalmology exams for early detection of glaucoma and other disorders of the eye.  Dental Screening: Recommended annual dental exams for proper oral hygiene  Community Resource Referral / Chronic Care Management: CRR required this visit?  No   CCM required this visit?  No     Plan:     I have personally reviewed and noted the following in the patient's chart:   Medical and social history Use of alcohol, tobacco or illicit drugs  Current medications and supplements including opioid prescriptions. Patient is not currently taking opioid prescriptions. Functional ability and status Nutritional status Physical activity Advanced  directives List of other physicians Hospitalizations, surgeries, and ER visits in previous 12 months Vitals Screenings to include cognitive, depression, and falls Referrals and appointments  In addition, I have reviewed and discussed with patient certain preventive protocols, quality metrics, and best practice recommendations.  A written personalized care plan for preventive services as well as general preventive health recommendations were provided to patient.     Kandis Fantasia [place], [place]   6/96/2952   After Visit Summary: (MyChart) Due to this being a telephonic visit, the after visit summary with patients personalized plan was offered to patient via MyChart   Notes: Nothing significant to report at this time.

## 2023-12-07 ENCOUNTER — Other Ambulatory Visit: Payer: Medicare HMO

## 2023-12-11 ENCOUNTER — Other Ambulatory Visit: Payer: Self-pay | Admitting: Nurse Practitioner

## 2023-12-11 DIAGNOSIS — B372 Candidiasis of skin and nail: Secondary | ICD-10-CM

## 2024-03-24 ENCOUNTER — Other Ambulatory Visit: Payer: Self-pay | Admitting: Family Medicine

## 2024-03-24 DIAGNOSIS — I10 Essential (primary) hypertension: Secondary | ICD-10-CM

## 2024-03-26 ENCOUNTER — Other Ambulatory Visit: Payer: Self-pay | Admitting: Family Medicine

## 2024-03-26 DIAGNOSIS — E039 Hypothyroidism, unspecified: Secondary | ICD-10-CM

## 2024-03-27 ENCOUNTER — Ambulatory Visit (INDEPENDENT_AMBULATORY_CARE_PROVIDER_SITE_OTHER): Payer: Medicare HMO | Admitting: Family Medicine

## 2024-03-27 ENCOUNTER — Encounter: Payer: Self-pay | Admitting: Family Medicine

## 2024-03-27 VITALS — BP 129/71 | HR 79 | Temp 97.3°F | Ht 65.0 in | Wt 192.6 lb

## 2024-03-27 DIAGNOSIS — E7800 Pure hypercholesterolemia, unspecified: Secondary | ICD-10-CM

## 2024-03-27 DIAGNOSIS — E039 Hypothyroidism, unspecified: Secondary | ICD-10-CM

## 2024-03-27 DIAGNOSIS — I10 Essential (primary) hypertension: Secondary | ICD-10-CM | POA: Diagnosis not present

## 2024-03-27 MED ORDER — LEVOTHYROXINE SODIUM 50 MCG PO TABS: 50.0000 ug | ORAL_TABLET | Freq: Every day | ORAL | 3 refills | Status: AC

## 2024-03-27 NOTE — Progress Notes (Signed)
 The office lost power during  Subjective:  Patient ID: Meredith Ruiz, female    DOB: May 05, 1946  Age: 78 y.o. MRN: [id number]  CC: Medical Management of Chronic Issues   HPI Meredith Ruiz presents for  follow-up on  thyroid. The patient has a history of hypothyroidism for many years. It has been stable recently. Pt. denies any change in  voice, loss of hair, heat or cold intolerance. Energy level has been adequate to good. Patient denies constipation and diarrhea. No myxedema. Medication is as noted below. Verified that pt is taking it daily on an empty stomach. Well tolerated.   follow-up of hypertension. Patient has no history of headache chest pain or shortness of breath or recent cough. Patient also denies symptoms of TIA such as numbness weakness lateralizing. Patient checks  blood pressure at home and has not had any elevated readings recently. Patient denies side effects from his medication. States taking it regularly.       03/27/2024    8:46 AM 11/04/2023   12:36 PM 03/22/2023    9:15 AM  Depression screen PHQ 2/9  Decreased Interest 0 0 0  Down, Depressed, Hopeless 0 0 0  PHQ - 2 Score 0 0 0    History [name] has a past medical history of Closed fracture distal radius and ulna, left, with routine healing, subsequent encounter 01/19/22 (03/03/2022), Hypertension, and Thyroid disease.   She has a past surgical history that includes Cesarean section.   Her family history includes Arthritis in her sister; Celiac disease in her son; Diabetes in her mother and sister; Heart attack in her father; Heart disease in her father.She reports that she quit smoking about 50 years ago. Her smoking use included cigarettes. She started smoking about 50 years ago. She has a 0.1 pack-year smoking history. She has never used smokeless tobacco. She reports that she does not drink alcohol and does not use drugs.    ROS Review of Systems  Constitutional: Negative.   HENT: Negative.    Eyes:   Negative for visual disturbance.  Respiratory:  Negative for shortness of breath.   Cardiovascular:  Negative for chest pain.  Gastrointestinal:  Negative for abdominal pain.  Musculoskeletal:  Negative for arthralgias.    Objective:  BP 129/71   Pulse 79   Temp (!) 97.3 F (36.3 C)   Ht 5' 5 (1.651 m)   Wt 192 lb 9.6 oz (87.4 kg)   SpO2 94%   BMI 32.05 kg/m   BP Readings from Last 3 Encounters:  03/27/24 129/71  09/27/23 (!) 144/75  03/22/23 (!) 143/71    Wt Readings from Last 3 Encounters:  03/27/24 192 lb 9.6 oz (87.4 kg)  11/04/23 192 lb (87.1 kg)  09/27/23 192 lb (87.1 kg)     Physical Exam Constitutional:      General: She is not in acute distress.    Appearance: She is well-developed.  Cardiovascular:     Rate and Rhythm: Normal rate and regular rhythm.  Pulmonary:     Breath sounds: Normal breath sounds.  Musculoskeletal:        General: Normal range of motion.  Skin:    General: Skin is warm and dry.  Neurological:     Mental Status: She is alert and oriented to person, place, and time.      Assessment & Plan:  Acquired hypothyroidism -     TSH + free T4 -     Levothyroxine Sodium; Take 1 tablet (  50 mcg total) by mouth daily.  Dispense: 90 tablet; Refill: 3  Essential hypertension -     CMP14+EGFR; Future  Elevated LDL cholesterol level -     Lipid panel   The office lost power toward the end of this patient's visit.  We were able to finish with ambient lighting.  However labs could not be drawn due to the need for processing such as centrifuging of the specimen.  As result she will follow-up for lab only appointment to have her blood work done.  Med changes will be based on blood work in addition to clinical findings  Follow-up: Return in about 6 months (around 09/27/2024).  Butler Der, M.D.

## 2024-03-28 ENCOUNTER — Other Ambulatory Visit

## 2024-03-28 DIAGNOSIS — I10 Essential (primary) hypertension: Secondary | ICD-10-CM

## 2024-03-28 DIAGNOSIS — E039 Hypothyroidism, unspecified: Secondary | ICD-10-CM | POA: Diagnosis not present

## 2024-03-28 DIAGNOSIS — E7800 Pure hypercholesterolemia, unspecified: Secondary | ICD-10-CM | POA: Diagnosis not present

## 2024-03-29 ENCOUNTER — Ambulatory Visit: Payer: Self-pay | Admitting: Family Medicine

## 2024-03-29 LAB — CMP14+EGFR
ALT: 11 IU/L (ref 0–32)
AST: 10 IU/L (ref 0–40)
Albumin: 4.2 g/dL (ref 3.8–4.8)
Alkaline Phosphatase: 144 IU/L — ABNORMAL HIGH (ref 44–121)
BUN/Creatinine Ratio: 18 (ref 12–28)
BUN: 18 mg/dL (ref 8–27)
Bilirubin Total: 0.4 mg/dL (ref 0.0–1.2)
CO2: 21 mmol/L (ref 20–29)
Calcium: 9.3 mg/dL (ref 8.7–10.3)
Chloride: 105 mmol/L (ref 96–106)
Creatinine, Ser: 1 mg/dL (ref 0.57–1.00)
Globulin, Total: 2.5 g/dL (ref 1.5–4.5)
Glucose: 110 mg/dL — ABNORMAL HIGH (ref 70–99)
Potassium: 4.6 mmol/L (ref 3.5–5.2)
Sodium: 142 mmol/L (ref 134–144)
Total Protein: 6.7 g/dL (ref 6.0–8.5)
eGFR: 58 mL/min/1.73 — ABNORMAL LOW (ref >59)

## 2024-03-30 NOTE — Progress Notes (Signed)
 TEST ADD ON FORM COMPLETED AND TURNED INTO [facility] TO ADD ON LIPID AND TSH THAT WERE NOT DRAWN

## 2024-03-30 NOTE — Telephone Encounter (Signed)
 Patient aware and verbalized understanding.

## 2024-04-01 LAB — LIPID PANEL W/O CHOL/HDL RATIO
Cholesterol, Total: 170 mg/dL (ref 100–199)
HDL: 42 mg/dL (ref >39)
LDL Chol Calc (NIH): 117 mg/dL — ABNORMAL HIGH (ref 0–99)
Triglycerides: 55 mg/dL (ref 0–149)
VLDL Cholesterol Cal: 11 mg/dL (ref 5–40)

## 2024-04-01 LAB — SPECIMEN STATUS REPORT

## 2024-04-01 LAB — TSH+FREE T4
Free T4: 1.27 ng/dL (ref 0.82–1.77)
TSH: 2.38 u[IU]/mL (ref 0.450–4.500)

## 2024-04-02 NOTE — Progress Notes (Signed)
Hello Meredith Ruiz,  Your lab result is normal and/or stable.Some minor variations that are not significant are commonly marked abnormal, but do not represent any medical problem for you.  Best regards, Mechele Claude, M.D.

## 2024-06-22 ENCOUNTER — Other Ambulatory Visit: Payer: Self-pay | Admitting: Family Medicine

## 2024-06-22 DIAGNOSIS — I10 Essential (primary) hypertension: Secondary | ICD-10-CM

## 2024-09-04 ENCOUNTER — Other Ambulatory Visit: Payer: Self-pay | Admitting: Family Medicine

## 2024-09-04 DIAGNOSIS — I10 Essential (primary) hypertension: Secondary | ICD-10-CM

## 2024-09-27 ENCOUNTER — Ambulatory Visit: Payer: Self-pay | Admitting: Family Medicine

## 2024-11-06 ENCOUNTER — Ambulatory Visit: Payer: Self-pay
